# Patient Record
Sex: Female | Born: 1960 | Race: White | Hispanic: No | Marital: Married | Smoking: Current every day smoker
Health system: Southern US, Community
[De-identification: ages and names within clinical notes are randomized; demographics above are authoritative.]

## PROBLEM LIST (undated history)

## (undated) DIAGNOSIS — L930 Discoid lupus erythematosus: Secondary | ICD-10-CM

## (undated) DIAGNOSIS — C4490 Unspecified malignant neoplasm of skin, unspecified: Secondary | ICD-10-CM

## (undated) HISTORY — DX: Discoid lupus erythematosus: L93.0

## (undated) HISTORY — PX: OTHER SURGICAL HISTORY: SHX169

## (undated) HISTORY — DX: Unspecified malignant neoplasm of skin, unspecified: C44.90

---

## 2005-11-23 ENCOUNTER — Ambulatory Visit: Payer: Self-pay | Admitting: Family Medicine

## 2005-11-25 ENCOUNTER — Ambulatory Visit: Payer: Self-pay | Admitting: Family Medicine

## 2015-12-02 ENCOUNTER — Ambulatory Visit (INDEPENDENT_AMBULATORY_CARE_PROVIDER_SITE_OTHER): Payer: Managed Care, Other (non HMO) | Admitting: Family Medicine

## 2015-12-02 ENCOUNTER — Encounter: Payer: Self-pay | Admitting: Family Medicine

## 2015-12-02 VITALS — BP 120/80 | HR 78 | Resp 16 | Ht 66.0 in | Wt 109.6 lb

## 2015-12-02 DIAGNOSIS — Z6379 Other stressful life events affecting family and household: Secondary | ICD-10-CM | POA: Diagnosis not present

## 2015-12-02 DIAGNOSIS — F1010 Alcohol abuse, uncomplicated: Secondary | ICD-10-CM | POA: Diagnosis not present

## 2015-12-02 DIAGNOSIS — Z720 Tobacco use: Secondary | ICD-10-CM | POA: Diagnosis not present

## 2015-12-02 DIAGNOSIS — F17200 Nicotine dependence, unspecified, uncomplicated: Secondary | ICD-10-CM

## 2015-12-02 NOTE — Progress Notes (Signed)
   Subjective:    Patient ID: Jacqueline Lang, female    DOB: 1960-12-16, 55 y.o.   MRN: [id number]  HPI She is here for consult concerning smoking. She has not been here in several years. She now states that she is ready. She has a 30+ pack year history of smoking. She now indicates the need to get in better health. She also has become more involved in her religion and wants a more healthy lifestyle.   Review of Systems     Objective:   Physical Exam Alert and in no distress otherwise not examined       Assessment & Plan:  Current smoker  Stressful life event affecting family  Alcohol abuse The first part of the encounter was spent discussing smoking cessation in regard to habit versus addiction and the use of Chantix which is what she wanted. Activities she needs to call the 800 number for screening smoking. Also recommend she keep track of her smoking habits in regard to when where and why and then make a list of things to do instead of smoking. We will then also start Chantix. At the end of the encounter, he came quite tearful and mentioned the fact that her mother died in 07-07-22 and also a grandchild is some trouble with the law. She states that she then started drinking again and stopped drinking yesterday. I then discussed the fact that we should not go any further with her in cessation. Strongly encouraged her to get involved with hospice bereavement. Explained that under the circumstances it would be inappropriate for me to give her Chantix. She is to return here for further consultation. Also recommended she set up a complete examination in near future as she has not had one in several years. Over 30 minutes, the entire time spent in counseling and coordination of care.

## 2015-12-02 NOTE — Patient Instructions (Addendum)
Call Hospice [phone]  Call 800 quit now Ref the pieces paper around her cigarettes and every time you light up right down when where and why. Do that for about a week then sit down and analyze it. Once you do that ,then you can make a list of things to do instead of smoking.

## 2015-12-15 ENCOUNTER — Ambulatory Visit (INDEPENDENT_AMBULATORY_CARE_PROVIDER_SITE_OTHER): Payer: Managed Care, Other (non HMO) | Admitting: Family Medicine

## 2015-12-15 ENCOUNTER — Encounter: Payer: Self-pay | Admitting: Family Medicine

## 2015-12-15 VITALS — BP 106/60 | HR 67 | Wt 110.0 lb

## 2015-12-15 DIAGNOSIS — Z720 Tobacco use: Secondary | ICD-10-CM | POA: Diagnosis not present

## 2015-12-15 DIAGNOSIS — Z716 Tobacco abuse counseling: Secondary | ICD-10-CM | POA: Diagnosis not present

## 2015-12-15 MED ORDER — VARENICLINE TARTRATE 0.5 MG X 11 & 1 MG X 42 PO MISC: ORAL | 0 refills | Status: DC

## 2015-12-15 NOTE — Progress Notes (Signed)
   Subjective:    Patient ID: Jacqueline Lang, female    DOB: November 06, 1960, 55 y.o.   MRN: [id number]  HPI She is here for recheck. She has remained alcohol free since her last visit. She states that she is ready to quit smoking. I discussed triggers with her however she states that she has not gotten around to making a list of this. She states that she plans to have a quit date in several weeks but is still slightly unsure about this.   Review of Systems     Objective:   Physical Exam Alert and in no distress otherwise not examined       Assessment & Plan:  Encounter for smoking cessation counseling - Plan: varenicline (CHANTIX STARTING MONTH PAK) 0.5 MG X 11 & 1 MG X 42 tablet  I discussed in detail the need for her to look at the triggers on why she smokes and come up with things to do instead of smoking. Encouraged her to set a quit date [place]. Discussed possible side effects of the Chantix. I will have her return here in about 6 weeks.

## 2015-12-15 NOTE — Patient Instructions (Signed)
Make a list of your triggers. Then also make a list of what you will do instead of smoking

## 2016-01-12 ENCOUNTER — Telehealth: Payer: Self-pay | Admitting: Family Medicine

## 2016-01-12 MED ORDER — VARENICLINE TARTRATE 1 MG PO TABS: 1.0000 mg | ORAL_TABLET | Freq: Two times a day (BID) | ORAL | 0 refills | Status: DC

## 2016-01-12 NOTE — Telephone Encounter (Signed)
Rcvd refill request for Chantix continuing pak

## 2016-02-07 ENCOUNTER — Other Ambulatory Visit: Payer: Self-pay | Admitting: Family Medicine

## 2016-02-08 NOTE — Telephone Encounter (Signed)
Is this okay to refill? 

## 2016-02-08 NOTE — Telephone Encounter (Signed)
Needs an appointment but don't let her run out

## 2016-09-02 ENCOUNTER — Telehealth: Payer: Self-pay | Admitting: Family Medicine

## 2016-09-02 NOTE — Telephone Encounter (Signed)
Not sure I fully understand the question Tammy?  Please see me about this.     I would also say if someone is going through depression, that should really be separate visit from physical

## 2016-09-02 NOTE — Telephone Encounter (Addendum)
Pt's daughter, Jacqueline Lang, called wanting to make a CPE appointment for pt with [facility]. She said pt is going through a depression and is overdue for a CPE. Daughter thinks pt will be more comfortable seeing Audelia Acton and will be more open/comfortable discussing her depression and medical concerns with Audelia Acton. Daughter,Crystal, also sees [place]. Is it ok to schedule CPE with [facility] Acton? Call daughter at [phone]

## 2018-04-02 ENCOUNTER — Encounter: Payer: Self-pay | Admitting: Psychiatry

## 2018-04-02 ENCOUNTER — Ambulatory Visit: Payer: BLUE CROSS/BLUE SHIELD | Admitting: Psychiatry

## 2018-04-02 VITALS — BP 136/85 | HR 72

## 2018-04-02 DIAGNOSIS — F1020 Alcohol dependence, uncomplicated: Secondary | ICD-10-CM | POA: Diagnosis not present

## 2018-04-02 DIAGNOSIS — F411 Generalized anxiety disorder: Secondary | ICD-10-CM

## 2018-04-02 DIAGNOSIS — F3176 Bipolar disorder, in full remission, most recent episode depressed: Secondary | ICD-10-CM

## 2018-04-02 MED ORDER — LAMOTRIGINE 150 MG PO TABS: 150.0000 mg | ORAL_TABLET | Freq: Every day | ORAL | 1 refills | Status: DC

## 2018-04-02 MED ORDER — NALTREXONE HCL 50 MG PO TABS: ORAL_TABLET | ORAL | 1 refills | Status: DC

## 2018-04-02 MED ORDER — GABAPENTIN 250 MG/5ML PO SOLN: 500.0000 mg | Freq: Two times a day (BID) | ORAL | 1 refills | Status: DC

## 2018-04-02 MED ORDER — DULOXETINE HCL 60 MG PO CPEP: 60.0000 mg | ORAL_CAPSULE | Freq: Every day | ORAL | 1 refills | Status: DC

## 2018-04-02 MED ORDER — BUSPIRONE HCL 15 MG PO TABS: 15.0000 mg | ORAL_TABLET | Freq: Two times a day (BID) | ORAL | 1 refills | Status: DC

## 2018-04-02 NOTE — Progress Notes (Signed)
Jacqueline Lang [id number] [birth year] 57 y.o.  Subjective:   Patient ID:  Jacqueline Lang is a 57 y.o. (DOB [birth year]) female.  Chief Complaint:  Chief Complaint  Patient presents with  . Alcohol Problem  . Follow-up    h/o Anxiety, Depression    HPI Jacqueline Lang presents to the office today for follow-up of mood and anxiety. She reports that she is now working at [employer] for the past 6 weeks. She reports that she is enjoying her job and was getting depressed with not being able to find a job. She reprts that mood is "much better since I am working. Makes me feel like I am contributing and it gets me out of the house." She reports that her depression has resolved since returning to work. She reports that her anxiety has significantly improved with leaving previous jobs. Denies any acute concerns with anxiety. Reports some family stressors and will worry about her children and grandchildren. She reports that she has been sleeping well. Reports stable appetite and has had 30 lb intentional weight loss. Energy and motivation have been adequate. Denies concentration impairment. She reports brief SI before getting her job after drinking excessive amounts of rum. She reports that she stopped driking liquor. She reports that she has been drinking wine and beer. She reports that she has been trying to manage this and occasionally drinks excessively. "I stopeed for 25 years and now I am back in it and kind of disappointed." She reports "mentally I don't know if I am ready to stop." She reports that she rarely uses Xanax prn. Denies current SI.   Past medication trials: Alprazolam Lamictal Cymbalta Gabapentin BuSpar Trileptal-ineffective Latuda- helped for several days and then signs and symptoms significantly worsened Abilify- concentration difficulties Hydroxyzine Propanolol Lorazepam  Review of Systems:  Review of Systems  Gastrointestinal: Negative.   Musculoskeletal: Negative for  gait problem.  Neurological: Negative for tremors.  Psychiatric/Behavioral:       Please refer to HPI    Medications: I have reviewed the patient's current medications.  Current Outpatient Medications  Medication Sig Dispense Refill  . ALPRAZolam (XANAX) 1 MG tablet Take 1 mg by mouth at bedtime as needed for anxiety.    . busPIRone (BUSPAR) 15 MG tablet Take 1 tablet (15 mg total) by mouth 2 (two) times daily. 180 tablet 1  . DULoxetine (CYMBALTA) 60 MG capsule Take 1 capsule (60 mg total) by mouth daily. 90 capsule 1  . lamoTRIgine (LAMICTAL) 150 MG tablet Take 1 tablet (150 mg total) by mouth daily. 90 tablet 1  . gabapentin (NEURONTIN) 250 MG/5ML solution Take 10 mLs (500 mg total) by mouth 2 (two) times daily. 2025 mL 1  . hydroxychloroquine (PLAQUENIL) 200 MG tablet Take 400 mg by mouth daily.    . Multiple Vitamins-Minerals (STRESS TAB NF PO) Take by mouth.    . naltrexone (DEPADE) 50 MG tablet Take 1 tab po qd x 1 week, and then may increase to 1 tab po BID 90 tablet 1   No current facility-administered medications for this visit.     Medication Side Effects: None  Allergies:  Allergies  Allergen Reactions  . Penicillins     Childhood allergy    Past Medical History:  Diagnosis Date  . Discoid lupus   . Skin cancer     Family History  Problem Relation Age of Onset  . Bipolar disorder Father   . Schizophrenia Sister   . Bipolar disorder Sister   .  Panic disorder Sister   . Bipolar disorder Paternal Grandmother   . Bipolar disorder Sister   . Bipolar disorder Sister   . Anxiety disorder Sister   . Bipolar disorder Sister   . Bipolar disorder Sister   . Anxiety disorder Daughter   . Depression Daughter     Social History   Socioeconomic History  . Marital status: Married    Spouse name: Not on file  . Number of children: Not on file  . Years of education: Not on file  . Highest education level: Not on file  Occupational History  . Not on file   Social Needs  . Financial resource strain: Not on file  . Food insecurity:    Worry: Not on file    Inability: Not on file  . Transportation needs:    Medical: Not on file    Non-medical: Not on file  Tobacco Use  . Smoking status: Former Smoker    Types: Cigarettes    Start date: 12/02/1995  . Smokeless tobacco: Never Used  Substance and Sexual Activity  . Alcohol use: Yes  . Drug use: Not on file  . Sexual activity: Not on file  Lifestyle  . Physical activity:    Days per week: Not on file    Minutes per session: Not on file  . Stress: Not on file  Relationships  . Social connections:    Talks on phone: Not on file    Gets together: Not on file    Attends religious service: Not on file    Active member of club or organization: Not on file    Attends meetings of clubs or organizations: Not on file    Relationship status: Not on file  . Intimate partner violence:    Fear of current or ex partner: Not on file    Emotionally abused: Not on file    Physically abused: Not on file    Forced sexual activity: Not on file  Other Topics Concern  . Not on file  Social History Narrative  . Not on file    Past Medical History, Surgical history, Social history, and Family history were reviewed and updated as appropriate.   Please see review of systems for further details on the patient's review from today.   Objective:   Physical Exam:  BP 136/85   Pulse 72   Physical Exam  Constitutional: She is oriented to person, place, and time. She appears well-developed. No distress.  Musculoskeletal: She exhibits no deformity.  Neurological: She is alert and oriented to person, place, and time. Coordination normal.  Psychiatric: She has a normal mood and affect. Her speech is normal and behavior is normal. Judgment and thought content normal. Her mood appears not anxious. Her affect is not angry, not blunt, not labile and not inappropriate. Cognition and memory are normal. She does  not exhibit a depressed mood. She expresses no homicidal and no suicidal ideation. She expresses no suicidal plans and no homicidal plans.  Insight intact. No auditory or visual hallucinations. No delusions.     Lab Review:  No results found for: NA, K, CL, CO2, GLUCOSE, BUN, CREATININE, CALCIUM, PROT, ALBUMIN, AST, ALT, ALKPHOS, BILITOT, GFRNONAA, GFRAA  No results found for: WBC, RBC, HGB, HCT, PLT, MCV, MCH, MCHC, RDW, LYMPHSABS, MONOABS, EOSABS, BASOSABS  No results found for: POCLITH, LITHIUM   No results found for: PHENYTOIN, PHENOBARB, VALPROATE, CBMZ   .res Assessment: Plan:   Patient seen for 30 minutes greater  than 50% of visit spent counseling patient regarding treatment options for alcohol dependence.  Patient reports some reluctance and ambivalence in regards to stopping alcohol use.  Discussed possible treatment options and patient reports that she would be interested in a medication that would help with alcohol cravings.  Discussed potential benefits, risks, and side effects of naltrexone.  Discussed that naltrexone blocks opiate receptors and that she should inform any medical providers that she is taking naltrexone in the event of an acute medical issue.  Patient verbalizes understanding.  Discussed starting naltrexone 50 mg daily for 1 week and that she could increase to 50 mg twice daily if needed and tolerated. 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  Future Appointments  Date Time Provider [place]  10/02/2018 10:00 AM Thayer Headings, PMHNP CP-CP None    No orders of the defined types were placed in this encounter.     -------------------------------

## 2018-04-17 ENCOUNTER — Other Ambulatory Visit: Payer: Self-pay

## 2018-04-17 MED ORDER — ALPRAZOLAM 1 MG PO TABS: 1.0000 mg | ORAL_TABLET | Freq: Every evening | ORAL | 5 refills | Status: DC | PRN

## 2018-04-30 ENCOUNTER — Other Ambulatory Visit: Payer: Self-pay | Admitting: Psychiatry

## 2018-04-30 DIAGNOSIS — F3176 Bipolar disorder, in full remission, most recent episode depressed: Secondary | ICD-10-CM

## 2018-04-30 DIAGNOSIS — F411 Generalized anxiety disorder: Secondary | ICD-10-CM

## 2018-06-14 ENCOUNTER — Telehealth: Payer: Self-pay | Admitting: Psychiatry

## 2018-06-14 NOTE — Telephone Encounter (Signed)
Pt left v-mail request refill for Xanax.   Pharm not given

## 2018-06-14 NOTE — Telephone Encounter (Signed)
Called [facility] to confirm xanax rx received from 04/2018 with 5 refills

## 2018-08-24 ENCOUNTER — Other Ambulatory Visit: Payer: Self-pay | Admitting: Psychiatry

## 2018-08-24 DIAGNOSIS — F411 Generalized anxiety disorder: Secondary | ICD-10-CM

## 2018-08-24 NOTE — Telephone Encounter (Signed)
Left pt. A VM. Will try to call her again.

## 2018-08-24 NOTE — Telephone Encounter (Signed)
Patient left vm @12 :11 today stated she would like her Gabepentin changed from liquid to a pill to be sent to [facility] on [address]

## 2018-08-27 MED ORDER — GABAPENTIN 300 MG PO CAPS: 300.0000 mg | ORAL_CAPSULE | Freq: Three times a day (TID) | ORAL | 1 refills | Status: DC

## 2018-08-27 NOTE — Addendum Note (Signed)
Addended by: Sharyl Nimrod on: 08/27/2018 03:38 PM   Modules accepted: Orders

## 2018-08-27 NOTE — Telephone Encounter (Signed)
I left her another VM today to return call.

## 2018-08-27 NOTE — Telephone Encounter (Signed)
Left pt. A VM of changes and informed to call back if dose not effective.

## 2018-09-21 ENCOUNTER — Other Ambulatory Visit: Payer: Self-pay | Admitting: Psychiatry

## 2018-09-21 DIAGNOSIS — F411 Generalized anxiety disorder: Secondary | ICD-10-CM

## 2018-10-02 ENCOUNTER — Encounter: Payer: Self-pay | Admitting: Psychiatry

## 2018-10-02 ENCOUNTER — Ambulatory Visit (INDEPENDENT_AMBULATORY_CARE_PROVIDER_SITE_OTHER): Payer: BLUE CROSS/BLUE SHIELD | Admitting: Psychiatry

## 2018-10-02 ENCOUNTER — Other Ambulatory Visit: Payer: Self-pay

## 2018-10-02 DIAGNOSIS — F411 Generalized anxiety disorder: Secondary | ICD-10-CM

## 2018-10-02 DIAGNOSIS — F3176 Bipolar disorder, in full remission, most recent episode depressed: Secondary | ICD-10-CM | POA: Diagnosis not present

## 2018-10-02 DIAGNOSIS — G4700 Insomnia, unspecified: Secondary | ICD-10-CM

## 2018-10-02 MED ORDER — LAMOTRIGINE 150 MG PO TABS: 150.0000 mg | ORAL_TABLET | Freq: Every day | ORAL | 1 refills | Status: DC

## 2018-10-02 MED ORDER — BUSPIRONE HCL 15 MG PO TABS: 15.0000 mg | ORAL_TABLET | Freq: Two times a day (BID) | ORAL | 1 refills | Status: DC

## 2018-10-02 MED ORDER — DULOXETINE HCL 60 MG PO CPEP: 60.0000 mg | ORAL_CAPSULE | Freq: Every day | ORAL | 1 refills | Status: DC

## 2018-10-02 NOTE — Progress Notes (Signed)
Jacqueline Lang [id number] [birth year] 59 y.o.  Virtual Visit via Telephone Note  I connected with@ on 10/02/18 at 10:00 AM EDT by telephone and verified that I am speaking with the correct person using two identifiers.   I discussed the limitations, risks, security and privacy concerns of performing an evaluation and management service by telephone and the availability of in person appointments. I also discussed with the patient that there may be a patient responsible charge related to this service. The patient expressed understanding and agreed to proceed.   I discussed the assessment and treatment plan with the patient. The patient was provided an opportunity to ask questions and all were answered. The patient agreed with the plan and demonstrated an understanding of the instructions.   The patient was advised to call back or seek an in-person evaluation if the symptoms worsen or if the condition fails to improve as anticipated.  I provided 30 minutes of non-face-to-face time during this encounter.  The patient was located at home.  The provider was located at home.   Thayer Headings, PMHNP   Subjective:   Patient ID:  Jacqueline Lang is a 58 y.o. (DOB Jan 28, 1961) female.  Chief Complaint:  Chief Complaint  Patient presents with  . Follow-up    h/o Depression, anxiety, and ETOH misuse    HPI NICHOLE NEYER presents for follow-up of anxiety, mood, and ETOH dependence. She reports that she had n/v with Naltrexone and that she did not take it again. She reports that she has been drinking more with being furloughed. Reports drinking a large bottle of wine a night. Reports that she has been debating about whether she wants to quit drinking. Denies any recent panic attacks- "just don't feel relaxed." Reports some worry r/t pandemic but does not consider worry to be excessive. She reports that her mood has been "ok." Denies irritability or depressed mood. She reports some difficulty falling  asleep and awakens several times during the night. Reports that she goes to bed between 1-2 am and sleeps until 9-10 am. Appetite has been good. She reports that her motivation is lower than she would like and is not cleaning her house like she normally would. Has not been walking outside like she normally would. Reports energy is ok. She reports adequate concentration. Denies SI.  Denies impulsive or risky behaviors. Denies any recent excessive spending or elevated mood.  She reports that she has been furloughed and is receiving unemployment. She reports that one of her sisters is living with her right now until she can be eligible for LTC facility. Reports sister has bipolar d/o.   Reports that cost of oral solution of gabapentin was more expensive and will switch to capsule. Reports that she often forgets to take second dose of Buspar.  Reports taking 1/2 tab of Xanax at noon and at HS.   Past medication trials: Alprazolam Lamictal Cymbalta Gabapentin BuSpar Trileptal-ineffective Latuda- helped for several days and then signs and symptoms significantly worsened Abilify- concentration difficulties Hydroxyzine Propanolol Lorazepam   Review of Systems:  Review of Systems  Musculoskeletal: Negative for gait problem.  Neurological: Negative for tremors and headaches.  Psychiatric/Behavioral:       Please refer to HPI    Medications: I have reviewed the patient's current medications.  Current Outpatient Medications  Medication Sig Dispense Refill  . busPIRone (BUSPAR) 15 MG tablet Take 1 tablet (15 mg total) by mouth 2 (two) times daily. 180 tablet 1  . gabapentin (NEURONTIN)  300 MG capsule TAKE 1 CAPSULE (300 MG TOTAL) BY MOUTH 3 (THREE) TIMES DAILY 270 capsule 0  . ALPRAZolam (XANAX) 1 MG tablet Take 1 tablet (1 mg total) by mouth at bedtime as needed for anxiety. 30 tablet 5  . DULoxetine (CYMBALTA) 60 MG capsule Take 1 capsule (60 mg total) by mouth daily. 90 capsule 1  .  hydroxychloroquine (PLAQUENIL) 200 MG tablet Take 400 mg by mouth daily.    Marland Kitchen lamoTRIgine (LAMICTAL) 150 MG tablet Take 1 tablet (150 mg total) by mouth daily. 90 tablet 1  . Multiple Vitamins-Minerals (STRESS TAB NF PO) Take by mouth.     No current facility-administered medications for this visit.     Medication Side Effects: None  Allergies:  Allergies  Allergen Reactions  . Penicillins     Childhood allergy    Past Medical History:  Diagnosis Date  . Discoid lupus   . Skin cancer     Family History  Problem Relation Age of Onset  . Bipolar disorder Father   . Schizophrenia Sister   . Bipolar disorder Sister   . Panic disorder Sister   . Bipolar disorder Paternal Grandmother   . Bipolar disorder Sister   . Bipolar disorder Sister   . Anxiety disorder Sister   . Bipolar disorder Sister   . Bipolar disorder Sister   . Anxiety disorder Daughter   . Depression Daughter     Social History   Socioeconomic History  . Marital status: Married    Spouse name: Not on file  . Number of children: Not on file  . Years of education: Not on file  . Highest education level: Not on file  Occupational History  . Not on file  Social Needs  . Financial resource strain: Not on file  . Food insecurity:    Worry: Not on file    Inability: Not on file  . Transportation needs:    Medical: Not on file    Non-medical: Not on file  Tobacco Use  . Smoking status: Former Smoker    Types: Cigarettes    Start date: 12/02/1995  . Smokeless tobacco: Never Used  Substance and Sexual Activity  . Alcohol use: Yes  . Drug use: Not on file  . Sexual activity: Not on file  Lifestyle  . Physical activity:    Days per week: Not on file    Minutes per session: Not on file  . Stress: Not on file  Relationships  . Social connections:    Talks on phone: Not on file    Gets together: Not on file    Attends religious service: Not on file    Active member of club or organization: Not on  file    Attends meetings of clubs or organizations: Not on file    Relationship status: Not on file  . Intimate partner violence:    Fear of current or ex partner: Not on file    Emotionally abused: Not on file    Physically abused: Not on file    Forced sexual activity: Not on file  Other Topics Concern  . Not on file  Social History Narrative  . Not on file    Past Medical History, Surgical history, Social history, and Family history were reviewed and updated as appropriate.   Please see review of systems for further details on the patient's review from today.   Objective:   Physical Exam:  There were no vitals taken for this visit.  Physical Exam Neurological:     Mental Status: She is alert and oriented to person, place, and time.     Cranial Nerves: No dysarthria.  Psychiatric:        Attention and Perception: Attention normal.        Mood and Affect: Mood normal.        Speech: Speech normal.        Behavior: Behavior is cooperative.        Thought Content: Thought content normal. Thought content is not paranoid or delusional. Thought content does not include homicidal or suicidal ideation. Thought content does not include homicidal or suicidal plan.        Cognition and Memory: Cognition and memory normal.        Judgment: Judgment normal.     Comments: Pt laughing frequently throughout exam which is inconsistent with baseline.     Lab Review:  No results found for: NA, K, CL, CO2, GLUCOSE, BUN, CREATININE, CALCIUM, PROT, ALBUMIN, AST, ALT, ALKPHOS, BILITOT, GFRNONAA, GFRAA  No results found for: WBC, RBC, HGB, HCT, PLT, MCV, MCH, MCHC, RDW, LYMPHSABS, MONOABS, EOSABS, BASOSABS  No results found for: POCLITH, LITHIUM   No results found for: PHENYTOIN, PHENOBARB, VALPROATE, CBMZ   .res Assessment: Plan:   Encourage patient to contact office if/when she would like assistance with stopping EtOH use.  Patient verbalizes understanding. Continue Cymbalta 60 mg  daily for anxiety and depression. Continue lamotrigine 150 mg daily for mood. Continue BuSpar 15 mg once to twice daily for anxiety. Continue gabapentin for anxiety.  Encourage patient to take gabapentin closer to bedtime to possibly improve insomnia.  Also advised patient to contact office if she has any issues of switching from oral solution to capsule since she reports that she is continuing to use remaining gabapentin oral solution. Will continue Xanax 1 mg daily as needed.  Patient advised not to take in combination with alcohol due to risks of CNS depression.  Patient verbalized understanding. Patient to follow-up in 6 months or sooner if clinically indicated.  Generalized anxiety disorder - Plan: busPIRone (BUSPAR) 15 MG tablet, DULoxetine (CYMBALTA) 60 MG capsule  Depressed bipolar I disorder in remission ([place]) - Plan: DULoxetine (CYMBALTA) 60 MG capsule, lamoTRIgine (LAMICTAL) 150 MG tablet  Insomnia, unspecified type  Please see After Visit Summary for patient specific instructions.  Future Appointments  Date Time Provider [place]  03/26/2019 10:00 AM Thayer Headings, PMHNP CP-CP None    No orders of the defined types were placed in this encounter.     -------------------------------

## 2018-12-17 ENCOUNTER — Other Ambulatory Visit: Payer: Self-pay | Admitting: Psychiatry

## 2018-12-17 DIAGNOSIS — F411 Generalized anxiety disorder: Secondary | ICD-10-CM

## 2018-12-20 ENCOUNTER — Other Ambulatory Visit: Payer: Self-pay | Admitting: Psychiatry

## 2018-12-20 NOTE — Telephone Encounter (Signed)
Okay to add refills next appt is 03/2019

## 2019-01-21 ENCOUNTER — Other Ambulatory Visit: Payer: Self-pay | Admitting: Psychiatry

## 2019-01-21 DIAGNOSIS — F3176 Bipolar disorder, in full remission, most recent episode depressed: Secondary | ICD-10-CM

## 2019-03-20 ENCOUNTER — Other Ambulatory Visit: Payer: Self-pay | Admitting: Psychiatry

## 2019-03-20 DIAGNOSIS — F411 Generalized anxiety disorder: Secondary | ICD-10-CM

## 2019-03-20 NOTE — Telephone Encounter (Signed)
Apt 11/17

## 2019-03-23 ENCOUNTER — Other Ambulatory Visit: Payer: Self-pay | Admitting: Psychiatry

## 2019-03-23 DIAGNOSIS — F411 Generalized anxiety disorder: Secondary | ICD-10-CM

## 2019-03-23 DIAGNOSIS — F3176 Bipolar disorder, in full remission, most recent episode depressed: Secondary | ICD-10-CM

## 2019-03-26 ENCOUNTER — Encounter: Payer: Self-pay | Admitting: Psychiatry

## 2019-03-26 ENCOUNTER — Ambulatory Visit (INDEPENDENT_AMBULATORY_CARE_PROVIDER_SITE_OTHER): Payer: BLUE CROSS/BLUE SHIELD | Admitting: Psychiatry

## 2019-03-26 ENCOUNTER — Other Ambulatory Visit: Payer: Self-pay

## 2019-03-26 DIAGNOSIS — F411 Generalized anxiety disorder: Secondary | ICD-10-CM

## 2019-03-26 DIAGNOSIS — F3176 Bipolar disorder, in full remission, most recent episode depressed: Secondary | ICD-10-CM

## 2019-03-26 MED ORDER — DULOXETINE HCL 60 MG PO CPEP: ORAL_CAPSULE | ORAL | 1 refills | Status: DC

## 2019-03-26 MED ORDER — LAMOTRIGINE 150 MG PO TABS: 150.0000 mg | ORAL_TABLET | Freq: Every day | ORAL | 1 refills | Status: DC

## 2019-03-26 MED ORDER — GABAPENTIN 300 MG PO CAPS: ORAL_CAPSULE | ORAL | 1 refills | Status: DC

## 2019-03-26 MED ORDER — ALPRAZOLAM 1 MG PO TABS: 1.0000 mg | ORAL_TABLET | Freq: Every evening | ORAL | 5 refills | Status: DC | PRN

## 2019-03-26 MED ORDER — BUSPIRONE HCL 15 MG PO TABS: 15.0000 mg | ORAL_TABLET | Freq: Two times a day (BID) | ORAL | 1 refills | Status: DC

## 2019-03-26 NOTE — Progress Notes (Signed)
Jacqueline Lang [id number] 1960-10-19 58 y.o.  Subjective:   Patient ID:  Jacqueline Lang is a 58 y.o. (DOB 13-May-1960) female.  Chief Complaint:  Chief Complaint  Patient presents with  . Follow-up    History of depression, anxiety, insomnia    HPI Jacqueline Lang presents to the office today for follow-up of depression and anxiety. Father died in 01/01/23 in her home with hospice care.  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 Plans to take some time off and search for work in the spring. She has been trying to help other family members.   Has been trying to go to bed earlier and wake up earlier, as if she was going to work. Has had moments of increased anxiety and denies panic attack. Some worry about family members.   Reports ETOH has been about the same or less. She reports drinking 3/4 bottle of wine a night. Started smoking again around the death of her father. She reports that her twin sister and other family members had significant difficulty with father's death and pt has been trying to focus on helping them. Denies persistent sad mood. Denies significant irritability. She reports adequate sleep with gabapentin or xanax. She reports that her appetite is ok and consistent with baseline. Reports that she intentionally lost weight. Energy and motivation have been good. Concentration has been adequate. Denies SI.   She reports that husband has been supportive.  Review of Systems:  Review of Systems  Musculoskeletal: Negative for gait problem.  Neurological: Negative for tremors.  Psychiatric/Behavioral:       Please refer to HPI    Medications: I have reviewed the patient's current medications.  Current Outpatient Medications  Medication Sig Dispense Refill  . [START ON 04/18/2019] ALPRAZolam (XANAX) 1 MG tablet Take 1 tablet (1 mg total) by mouth at bedtime as needed for anxiety. 30 tablet 5  . busPIRone (BUSPAR) 15 MG tablet  Take 1 tablet (15 mg total) by mouth 2 (two) times daily. 180 tablet 1  . DULoxetine (CYMBALTA) 60 MG capsule TAKE 1 CAPSULE BY MOUTH EVERY DAY 90 capsule 1  . gabapentin (NEURONTIN) 300 MG capsule TAKE 1 CAPSULE BY MOUTH THREE TIMES A DAY 270 capsule 1  . hydroxychloroquine (PLAQUENIL) 200 MG tablet Take 400 mg by mouth daily.    Marland Kitchen lamoTRIgine (LAMICTAL) 150 MG tablet Take 1 tablet (150 mg total) by mouth daily. 90 tablet 1  . Multiple Vitamins-Minerals (STRESS TAB NF PO) Take by mouth.     No current facility-administered medications for this visit.     Medication Side Effects: None  Allergies:  Allergies  Allergen Reactions  . Penicillins     Childhood allergy    Past Medical History:  Diagnosis Date  . Discoid lupus   . Skin cancer     Family History  Problem Relation Age of Onset  . Bipolar disorder Father   . Schizophrenia Sister   . Bipolar disorder Sister   . Panic disorder Sister   . Bipolar disorder Paternal Grandmother   . Bipolar disorder Sister   . Bipolar disorder Sister   . Anxiety disorder Sister   . Bipolar disorder Sister   . Bipolar disorder Sister   . Anxiety disorder Daughter   . Depression Daughter     Social History   Socioeconomic History  . Marital status: Married    Spouse name: Not on file  .  Number of children: Not on file  . Years of education: Not on file  . Highest education level: Not on file  Occupational History  . Not on file  Social Needs  . Financial resource strain: Not on file  . Food insecurity    Worry: Not on file    Inability: Not on file  . Transportation needs    Medical: Not on file    Non-medical: Not on file  Tobacco Use  . Smoking status: Former Smoker    Types: Cigarettes    Start date: 12/02/1995  . Smokeless tobacco: Never Used  Substance and Sexual Activity  . Alcohol use: Yes  . Drug use: Not on file  . Sexual activity: Not on file  Lifestyle  . Physical activity    Days per week: Not on file     Minutes per session: Not on file  . Stress: Not on file  Relationships  . Social Herbalist on phone: Not on file    Gets together: Not on file    Attends religious service: Not on file    Active member of club or organization: Not on file    Attends meetings of clubs or organizations: Not on file    Relationship status: Not on file  . Intimate partner violence    Fear of current or ex partner: Not on file    Emotionally abused: Not on file    Physically abused: Not on file    Forced sexual activity: Not on file  Other Topics Concern  . Not on file  Social History Narrative  . Not on file    Past Medical History, Surgical history, Social history, and Family history were reviewed and updated as appropriate.   Please see review of systems for further details on the patient's review from today.   Objective:   Physical Exam:  Wt 120 lb (54.4 kg)   BMI 19.37 kg/m   Physical Exam Constitutional:      General: She is not in acute distress.    Appearance: She is well-developed.  Musculoskeletal:        General: No deformity.  Neurological:     Mental Status: She is alert and oriented to person, place, and time.     Coordination: Coordination normal.  Psychiatric:        Attention and Perception: Attention and perception normal. She does not perceive auditory or visual hallucinations.        Mood and Affect: Mood normal. Mood is not anxious or depressed. Affect is not labile, blunt, angry or inappropriate.        Speech: Speech normal.        Behavior: Behavior normal.        Thought Content: Thought content normal. Thought content does not include homicidal or suicidal ideation. Thought content does not include homicidal or suicidal plan.        Cognition and Memory: Cognition and memory normal.        Judgment: Judgment normal.     Comments: Insight intact. No delusions.      Lab Review:  No results found for: NA, K, CL, CO2, GLUCOSE, BUN, CREATININE,  CALCIUM, PROT, ALBUMIN, AST, ALT, ALKPHOS, BILITOT, GFRNONAA, GFRAA  No results found for: WBC, RBC, HGB, HCT, PLT, MCV, MCH, MCHC, RDW, LYMPHSABS, MONOABS, EOSABS, BASOSABS  No results found for: POCLITH, LITHIUM   No results found for: PHENYTOIN, PHENOBARB, VALPROATE, CBMZ   .res Assessment: Plan:  Will continue current plan of care since target signs and symptoms are well controlled without any tolerability issues. Patient reports that she has been taking BuSpar typically once daily.  Recommended increasing to twice daily to improve response and that she could take second dose earlier or take a lower dose if she experiences any tolerability issues. Patient to follow-up in 6 months or sooner if clinically indicated. Patient advised to contact office with any questions, adverse effects, or acute worsening in signs and symptoms.  Jacqueline Lang was seen today for follow-up.  Diagnoses and all orders for this visit:  Generalized anxiety disorder -     ALPRAZolam (XANAX) 1 MG tablet; Take 1 tablet (1 mg total) by mouth at bedtime as needed for anxiety. -     busPIRone (BUSPAR) 15 MG tablet; Take 1 tablet (15 mg total) by mouth 2 (two) times daily. 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     Please see After Visit Summary for patient specific instructions.  Future Appointments  Date Time Provider [place]  09/23/2019  9:30 AM Thayer Headings, PMHNP CP-CP None    No orders of the defined types were placed in this encounter.   -------------------------------

## 2019-04-16 ENCOUNTER — Other Ambulatory Visit: Payer: Self-pay | Admitting: Psychiatry

## 2019-04-16 DIAGNOSIS — F411 Generalized anxiety disorder: Secondary | ICD-10-CM

## 2019-04-18 ENCOUNTER — Other Ambulatory Visit: Payer: Self-pay | Admitting: Psychiatry

## 2019-04-18 DIAGNOSIS — F411 Generalized anxiety disorder: Secondary | ICD-10-CM

## 2019-08-21 ENCOUNTER — Other Ambulatory Visit: Payer: Self-pay | Admitting: Psychiatry

## 2019-08-21 DIAGNOSIS — F3176 Bipolar disorder, in full remission, most recent episode depressed: Secondary | ICD-10-CM

## 2019-09-16 ENCOUNTER — Encounter: Payer: Self-pay | Admitting: Psychiatry

## 2019-09-16 ENCOUNTER — Other Ambulatory Visit: Payer: Self-pay

## 2019-09-16 ENCOUNTER — Ambulatory Visit (INDEPENDENT_AMBULATORY_CARE_PROVIDER_SITE_OTHER): Payer: 59 | Admitting: Psychiatry

## 2019-09-16 DIAGNOSIS — F411 Generalized anxiety disorder: Secondary | ICD-10-CM | POA: Diagnosis not present

## 2019-09-16 DIAGNOSIS — F3176 Bipolar disorder, in full remission, most recent episode depressed: Secondary | ICD-10-CM

## 2019-09-16 MED ORDER — LAMOTRIGINE 150 MG PO TABS: 150.0000 mg | ORAL_TABLET | Freq: Every day | ORAL | 1 refills | Status: DC

## 2019-09-16 MED ORDER — DULOXETINE HCL 60 MG PO CPEP: ORAL_CAPSULE | ORAL | 1 refills | Status: DC

## 2019-09-16 MED ORDER — ALPRAZOLAM 1 MG PO TABS: 1.0000 mg | ORAL_TABLET | Freq: Every evening | ORAL | 5 refills | Status: DC | PRN

## 2019-09-16 NOTE — Progress Notes (Signed)
Jacqueline Lang [id number] 10-30-1960 59 y.o.  Subjective:   Patient ID:  Jacqueline Lang is a 59 y.o. (DOB 10/07/1960) female.  Chief Complaint:  Chief Complaint  Patient presents with  . Follow-up    h/o Anxiety and depression    HPI Jacqueline Lang presents to the office today for follow-up of mood and anxiety. She reports that she is still on unemployment. Has been working some on yard again and has not had the desire to do this in years. She reports that she has been working on it extensively. She reports that she has lost some weight with doing yard work. Denies any impulsive or risky behavior. Spent some money to help daughter organize her house and improve her home. Denies any other manic s/s.   She reports that she has "moments" of anxiety. 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 She denies any persistent depression. She reports that she will periodically grieve the loss of her father especially with certain triggers. Energy and motivation have been good. Has been sleeping ok. Averaging about 6 hours a night. Appetite has been good. Does not eat breakfast. Eats lunch and dinner. Concentration has been good. Denies SI.   She was taking Gabapentin only once a day and then increased to TID. Was taking Buspar once daily and increased this to BID a month ago. She reports that her energy has improved.   She reports that her ETOH use has been the "same. I'm not at that place yet."   Past medication trials: Alprazolam Lamictal Cymbalta Gabapentin BuSpar Trileptal-ineffective Latuda- helped for several days and then signs and symptoms significantly worsened Abilify- concentration difficulties Hydroxyzine Propanolol Lorazepam  Review of Systems:  Review of Systems  Musculoskeletal: Positive for back pain. Negative for gait problem.  Skin: Negative for rash.  Neurological: Negative for tremors.  Psychiatric/Behavioral:       Please  refer to HPI    Medications: I have reviewed the patient's current medications.  Current Outpatient Medications  Medication Sig Dispense Refill  . [START ON 09/18/2019] ALPRAZolam (XANAX) 1 MG tablet Take 1 tablet (1 mg total) by mouth at bedtime as needed for anxiety. 30 tablet 5  . busPIRone (BUSPAR) 15 MG tablet Take 15 mg by mouth 2 (two) times daily.    . DULoxetine (CYMBALTA) 60 MG capsule TAKE 1 CAPSULE BY MOUTH EVERY DAY 90 capsule 1  . gabapentin (NEURONTIN) 300 MG capsule TAKE 1 CAPSULE BY MOUTH THREE TIMES A DAY 270 capsule 1  . lamoTRIgine (LAMICTAL) 150 MG tablet Take 1 tablet (150 mg total) by mouth daily. 90 tablet 1  . hydroxychloroquine (PLAQUENIL) 200 MG tablet Take 400 mg by mouth daily.     No current facility-administered medications for this visit.    Medication Side Effects: None  Allergies:  Allergies  Allergen Reactions  . Penicillins     Childhood allergy    Past Medical History:  Diagnosis Date  . Discoid lupus   . Skin cancer     Family History  Problem Relation Age of Onset  . Bipolar disorder Father   . Schizophrenia Sister   . Bipolar disorder Sister   . Panic disorder Sister   . Bipolar disorder Paternal Grandmother   . Bipolar disorder Sister   . Bipolar disorder Sister   . Anxiety disorder Sister   . Bipolar disorder Sister   . Bipolar disorder Sister   . Anxiety disorder Daughter   .  Depression Daughter     Social History   Socioeconomic History  . Marital status: Married    Spouse name: Not on file  . Number of children: Not on file  . Years of education: Not on file  . Highest education level: Not on file  Occupational History  . Not on file  Tobacco Use  . Smoking status: Current Every Day Smoker    Types: Cigarettes    Start date: 12/02/1995  . Smokeless tobacco: Never Used  Substance and Sexual Activity  . Alcohol use: Yes  . Drug use: Not on file  . Sexual activity: Not on file  Other Topics Concern  . Not on  file  Social History Narrative  . Not on file   Social Determinants of Health   Financial Resource Strain:   . Difficulty of Paying Living Expenses:   Food Insecurity:   . Worried About Charity fundraiser in the Last Year:   . Arboriculturist in the Last Year:   Transportation Needs:   . Film/video editor (Medical):   Marland Kitchen Lack of Transportation (Non-Medical):   Physical Activity:   . Days of Exercise per Week:   . Minutes of Exercise per Session:   Stress:   . Feeling of Stress :   Social Connections:   . Frequency of Communication with Friends and Family:   . Frequency of Social Gatherings with Friends and Family:   . Attends Religious Services:   . Active Member of Clubs or Organizations:   . Attends Archivist Meetings:   Marland Kitchen Marital Status:   Intimate Partner Violence:   . Fear of Current or Ex-Partner:   . Emotionally Abused:   Marland Kitchen Physically Abused:   . Sexually Abused:     Past Medical History, Surgical history, Social history, and Family history were reviewed and updated as appropriate.   Please see review of systems for further details on the patient's review from today.   Objective:   Physical Exam:  There were no vitals taken for this visit.  Physical Exam Constitutional:      General: She is not in acute distress. Musculoskeletal:        General: No deformity.  Neurological:     Mental Status: She is alert and oriented to person, place, and time.     Coordination: Coordination normal.  Psychiatric:        Attention and Perception: Attention and perception normal. She does not perceive auditory or visual hallucinations.        Mood and Affect: Mood normal. Mood is not anxious or depressed. Affect is not labile, blunt, angry or inappropriate.        Speech: Speech normal.        Behavior: Behavior normal.        Thought Content: Thought content normal. Thought content is not paranoid or delusional. Thought content does not include homicidal  or suicidal ideation. Thought content does not include homicidal or suicidal plan.        Cognition and Memory: Cognition and memory normal.        Judgment: Judgment normal.     Comments: Insight intact     Lab Review:  No results found for: NA, K, CL, CO2, GLUCOSE, BUN, CREATININE, CALCIUM, PROT, ALBUMIN, AST, ALT, ALKPHOS, BILITOT, GFRNONAA, GFRAA  No results found for: WBC, RBC, HGB, HCT, PLT, MCV, MCH, MCHC, RDW, LYMPHSABS, MONOABS, EOSABS, BASOSABS  No results found for: POCLITH, LITHIUM  No results found for: PHENYTOIN, PHENOBARB, VALPROATE, CBMZ   .res Assessment: Plan:   Will continue current plan of care since target signs and symptoms are well controlled without any tolerability issues. Pt reports that she is not ready to decrease ETOH use at this time. Pt advised to notify provider if she would like any treatment or referrals to help with stopping ETOH in the future and pt verbalized understanding.  Continue Cymbalta 60 mg po qd for depression and anxiety.  Continue Buspar 15 mg po BID for anxiety.  Continue Gabapentin 300 mg po TID for anxiety.  Continue Lamictal 150 mg po qd for mood s/s. Continue Xanax 1 mg po QHS prn insomnia. Pt to f/u in 6 months or sooner if clinically indicated.  Patient advised to contact office with any questions, adverse effects, or acute worsening in signs and symptoms.  Lakaila was seen today for follow-up.  Diagnoses and all orders for this visit:  Generalized anxiety disorder -     ALPRAZolam (XANAX) 1 MG tablet; Take 1 tablet (1 mg total) by mouth at bedtime as needed for anxiety. 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     Please see After Visit Summary for patient specific  instructions.  Future Appointments  Date Time Provider [place]  03/18/2020 10:00 AM Thayer Headings, PMHNP CP-CP None    No orders of the defined types were placed in this encounter.   -------------------------------

## 2019-09-23 ENCOUNTER — Ambulatory Visit: Payer: Self-pay | Admitting: Psychiatry

## 2019-10-21 ENCOUNTER — Other Ambulatory Visit: Payer: Self-pay | Admitting: Psychiatry

## 2019-10-21 DIAGNOSIS — F411 Generalized anxiety disorder: Secondary | ICD-10-CM

## 2020-02-18 ENCOUNTER — Other Ambulatory Visit: Payer: Self-pay | Admitting: Psychiatry

## 2020-02-18 DIAGNOSIS — F411 Generalized anxiety disorder: Secondary | ICD-10-CM

## 2020-02-18 NOTE — Telephone Encounter (Signed)
review 

## 2020-03-16 ENCOUNTER — Encounter: Payer: Self-pay | Admitting: Psychiatry

## 2020-03-16 ENCOUNTER — Other Ambulatory Visit: Payer: Self-pay

## 2020-03-16 ENCOUNTER — Ambulatory Visit (INDEPENDENT_AMBULATORY_CARE_PROVIDER_SITE_OTHER): Payer: 59 | Admitting: Psychiatry

## 2020-03-16 DIAGNOSIS — F411 Generalized anxiety disorder: Secondary | ICD-10-CM

## 2020-03-16 DIAGNOSIS — F3176 Bipolar disorder, in full remission, most recent episode depressed: Secondary | ICD-10-CM

## 2020-03-16 DIAGNOSIS — G4700 Insomnia, unspecified: Secondary | ICD-10-CM

## 2020-03-16 MED ORDER — ALPRAZOLAM 1 MG PO TABS: 1.0000 mg | ORAL_TABLET | Freq: Every evening | ORAL | 5 refills | Status: DC | PRN

## 2020-03-16 MED ORDER — DULOXETINE HCL 60 MG PO CPEP: ORAL_CAPSULE | ORAL | 1 refills | Status: DC

## 2020-03-16 MED ORDER — LAMOTRIGINE 150 MG PO TABS: 150.0000 mg | ORAL_TABLET | Freq: Every day | ORAL | 1 refills | Status: DC

## 2020-03-16 NOTE — Progress Notes (Signed)
Jacqueline Lang [id number] 10-15-1960 59 y.o.  Subjective:   Patient ID:  Jacqueline Lang is a 59 y.o. (DOB 1960-06-30) female.  Chief Complaint:  Chief Complaint  Patient presents with  . Follow-up    h/o anxiety, depression, and sleep disturbance    HPI Jacqueline Lang presents to the office today for follow-up of mood disturbance and anxiety. She reports that she tried 2 different jobs and left those jobs due to odd hours, weekends, and physically demanding jobs. She reports that she will start working at [employer] and is looking forward to this. Will be working 7:30 am-12:30 pm and will be working the same schedule as her husband. She reports that previous jobs resulted in altered sleep wake cycle.    She reports that she had some stress and anxiety with other jobs. Anxiety has been manageable since she left job. Has some mild anticipatory anxiety about new jobs. She reports that her mood has been ok. Denies depressed mood. Denies irritability. Has difficulty falling asleep and averages about 6 hours of sleep a night. Reports that she lost weight at her last job due to not getting a break in an 8 hour period and was staying on her feet. Energy and motivation have been ok. Concentration has been adequate. Denies SI.  Denies any change in ETOH use. She reports that she is not ready to change ETOH use at this time.   Typically only taking Gabapentin once daily.   Twin sister's husband was dx'd with Alzheimer's and sister is having difficulty with this. Sister lives behind her and comes to her for support. Daughter left a job that pt thought was causing her stress.   Past medication trials: Alprazolam Lamictal Cymbalta Gabapentin BuSpar Trileptal-ineffective Latuda-helped for several days and then signs and symptoms significantly worsened Abilify-concentration difficulties Hydroxyzine Propanolol Lorazepam  Review of Systems:  Review of Systems  Gastrointestinal: Negative.    Musculoskeletal: Negative for gait problem.  Neurological: Negative for tremors and headaches.  Psychiatric/Behavioral:       Please refer to HPI    Medications: I have reviewed the patient's current medications.  Current Outpatient Medications  Medication Sig Dispense Refill  . ALPRAZolam (XANAX) 1 MG tablet Take 1 tablet (1 mg total) by mouth at bedtime as needed for anxiety. 30 tablet 5  . busPIRone (BUSPAR) 15 MG tablet Take 15 mg by mouth daily.     . DULoxetine (CYMBALTA) 60 MG capsule TAKE 1 CAPSULE BY MOUTH EVERY DAY 90 capsule 1  . gabapentin (NEURONTIN) 300 MG capsule TAKE 1 CAPSULE BY MOUTH THREE TIMES A DAY 270 capsule 1  . lamoTRIgine (LAMICTAL) 150 MG tablet Take 1 tablet (150 mg total) by mouth daily. 90 tablet 1   No current facility-administered medications for this visit.    Medication Side Effects: None  Allergies:  Allergies  Allergen Reactions  . Penicillins     Childhood allergy    Past Medical History:  Diagnosis Date  . Discoid lupus   . Skin cancer     Family History  Problem Relation Age of Onset  . Bipolar disorder Father   . Schizophrenia Sister   . Bipolar disorder Sister   . Panic disorder Sister   . Bipolar disorder Paternal Grandmother   . Bipolar disorder Sister   . Bipolar disorder Sister   . Anxiety disorder Sister   . Bipolar disorder Sister   . Bipolar disorder Sister   . Anxiety disorder Daughter   .  Depression Daughter     Social History   Socioeconomic History  . Marital status: Married    Spouse name: Not on file  . Number of children: Not on file  . Years of education: Not on file  . Highest education level: Not on file  Occupational History  . Not on file  Tobacco Use  . Smoking status: Current Every Day Smoker    Types: Cigarettes    Start date: 12/02/1995  . Smokeless tobacco: Never Used  Substance and Sexual Activity  . Alcohol use: Yes  . Drug use: Not on file  . Sexual activity: Not on file  Other  Topics Concern  . Not on file  Social History Narrative  . Not on file   Social Determinants of Health   Financial Resource Strain:   . Difficulty of Paying Living Expenses: Not on file  Food Insecurity:   . Worried About Charity fundraiser in the Last Year: Not on file  . Ran Out of Food in the Last Year: Not on file  Transportation Needs:   . Lack of Transportation (Medical): Not on file  . Lack of Transportation (Non-Medical): Not on file  Physical Activity:   . Days of Exercise per Week: Not on file  . Minutes of Exercise per Session: Not on file  Stress:   . Feeling of Stress : Not on file  Social Connections:   . Frequency of Communication with Friends and Family: Not on file  . Frequency of Social Gatherings with Friends and Family: Not on file  . Attends Religious Services: Not on file  . Active Member of Clubs or Organizations: Not on file  . Attends Archivist Meetings: Not on file  . Marital Status: Not on file  Intimate Partner Violence:   . Fear of Current or Ex-Partner: Not on file  . Emotionally Abused: Not on file  . Physically Abused: Not on file  . Sexually Abused: Not on file    Past Medical History, Surgical history, Social history, and Family history were reviewed and updated as appropriate.   Please see review of systems for further details on the patient's review from today.   Objective:   Physical Exam:  There were no vitals taken for this visit.  Physical Exam Constitutional:      General: She is not in acute distress. Musculoskeletal:        General: No deformity.  Neurological:     Mental Status: She is alert and oriented to person, place, and time.     Coordination: Coordination normal.  Psychiatric:        Attention and Perception: Attention and perception normal. She does not perceive auditory or visual hallucinations.        Mood and Affect: Mood normal. Mood is not anxious or depressed. Affect is not labile, blunt, angry  or inappropriate.        Speech: Speech normal.        Behavior: Behavior normal.        Thought Content: Thought content normal. Thought content is not paranoid or delusional. Thought content does not include homicidal or suicidal ideation. Thought content does not include homicidal or suicidal plan.        Cognition and Memory: Cognition and memory normal.        Judgment: Judgment normal.     Comments: Insight intact     Lab Review:  No results found for: NA, K, CL, CO2, GLUCOSE, BUN,  CREATININE, CALCIUM, PROT, ALBUMIN, AST, ALT, ALKPHOS, BILITOT, GFRNONAA, GFRAA  No results found for: WBC, RBC, HGB, HCT, PLT, MCV, MCH, MCHC, RDW, LYMPHSABS, MONOABS, EOSABS, BASOSABS  No results found for: POCLITH, LITHIUM   No results found for: PHENYTOIN, PHENOBARB, VALPROATE, CBMZ   .res Assessment: Plan:   Continue Xanax 1mg  1/2 tab po BID prn anxiety.  Continue Buspar 15 mg po qd for anxiety.  Continue Cymbalta 60 mg po qd for depression and anxiety.  Continue Lamictal 150 mg po qd for mood s/s. Continue Gabapentin 300 mg po TID for anxiety.  Pt to follow-up in 6 months or sooner if clinically indicated.  Patient advised to contact office with any questions, adverse effects, or acute worsening in signs and symptoms.  Meleah was seen today for follow-up.  Diagnoses and all orders for this visit:  Generalized anxiety disorder -     ALPRAZolam (XANAX) 1 MG tablet; Take 1 tablet (1 mg total) by mouth at bedtime as needed for anxiety. 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     Please see After Visit Summary for patient specific instructions.  Future Appointments  Date Time Provider [place]  09/15/2020  3:30 PM Thayer Headings,  PMHNP CP-CP None    No orders of the defined types were placed in this encounter.   -------------------------------

## 2020-03-18 ENCOUNTER — Ambulatory Visit: Payer: 59 | Admitting: Psychiatry

## 2020-05-20 ENCOUNTER — Other Ambulatory Visit: Payer: Self-pay

## 2020-05-20 ENCOUNTER — Encounter: Payer: Self-pay | Admitting: Family Medicine

## 2020-05-20 ENCOUNTER — Ambulatory Visit (INDEPENDENT_AMBULATORY_CARE_PROVIDER_SITE_OTHER): Payer: 59 | Admitting: Family Medicine

## 2020-05-20 ENCOUNTER — Ambulatory Visit
Admission: RE | Admit: 2020-05-20 | Discharge: 2020-05-20 | Disposition: A | Discharge status: Home / Self Care | Payer: 59 | Source: Ambulatory Visit | Attending: Family Medicine | Admitting: Family Medicine

## 2020-05-20 VITALS — BP 150/76 | HR 83 | Temp 96.0°F | Ht 64.0 in | Wt 110.0 lb

## 2020-05-20 DIAGNOSIS — G629 Polyneuropathy, unspecified: Secondary | ICD-10-CM

## 2020-05-20 DIAGNOSIS — R0989 Other specified symptoms and signs involving the circulatory and respiratory systems: Secondary | ICD-10-CM | POA: Insufficient documentation

## 2020-05-20 DIAGNOSIS — F17210 Nicotine dependence, cigarettes, uncomplicated: Secondary | ICD-10-CM

## 2020-05-20 DIAGNOSIS — Z789 Other specified health status: Secondary | ICD-10-CM

## 2020-05-20 DIAGNOSIS — R292 Abnormal reflex: Secondary | ICD-10-CM | POA: Insufficient documentation

## 2020-05-20 DIAGNOSIS — L659 Nonscarring hair loss, unspecified: Secondary | ICD-10-CM

## 2020-05-20 DIAGNOSIS — R634 Abnormal weight loss: Secondary | ICD-10-CM | POA: Insufficient documentation

## 2020-05-20 DIAGNOSIS — R10811 Right upper quadrant abdominal tenderness: Secondary | ICD-10-CM

## 2020-05-20 DIAGNOSIS — R208 Other disturbances of skin sensation: Secondary | ICD-10-CM

## 2020-05-20 DIAGNOSIS — F1090 Alcohol use, unspecified, uncomplicated: Secondary | ICD-10-CM | POA: Insufficient documentation

## 2020-05-20 LAB — POCT URINALYSIS DIP (PROADVANTAGE DEVICE)
Bilirubin, UA: NEGATIVE
Blood, UA: NEGATIVE
Glucose, UA: NEGATIVE mg/dL
Ketones, POC UA: NEGATIVE mg/dL
Leukocytes, UA: NEGATIVE
Nitrite, UA: NEGATIVE
Protein Ur, POC: NEGATIVE mg/dL
Specific Gravity, Urine: 1.015
Urobilinogen, Ur: 0.2
pH, UA: 6 (ref 5.0–8.0)

## 2020-05-20 NOTE — Progress Notes (Signed)
Subjective:    Patient ID: Jacqueline Lang, female    DOB: 06-12-1960, 60 y.o.   MRN: [id number]  HPI Chief Complaint  Patient presents with  . buring and numbness in both feet    She is new to me and has not been seen in this practice since 2017.  States she has not had any medical care in years and no labs in more than 10 years.   Complains of numbness, tingling and burning in all of her toes bilaterally. This has been ongoing for 2 years but worsening over the past couple of months. States her right foot bothers her more than the left.  Pain and numbness are worse during the day when she is ambulating.  Pain is not keeping her awake at night.   She reports other symptoms such as unexplained weight loss (35 lbs in the past 6 months), hair thinning and night sweats for the past year.   Menopause early to mid 29s.   States she thinks she is getting plenty of calories. Drinks a Boost for breakfast and then eats lunch and dinner.   No abdominal pain, N/V/D or constipation.  No blood in her stool.   She has been on gabapentin and Cymbalta for mental health.  Thayer Headings at [facility] has been prescribing her medications.   States she started on a low dose aspirin a few days ago.   She has been smoking 2 ppd until this past weekend and she cut back to 15 cigarettes per day.   Alcohol abuse- drinks 2 bottles of wine per night. Stopped dirnking for 20 years and started back 5 years ago.   Married. 2 kids. Works at [employer].   Denies fever, chills, chest pain, palpitations, shortness of breath, abdominal pain, N/V/D, urinary symptoms, LE edema.   Reviewed allergies, medications, past medical, surgical, family, and social history.    Review of Systems Pertinent positives and negatives in the history of present illness.     Objective:   Physical Exam Constitutional:      General: She is not in acute distress.    Appearance: She is not ill-appearing.  Eyes:      Conjunctiva/sclera: Conjunctivae normal.     Pupils: Pupils are equal, round, and reactive to light.  Cardiovascular:     Rate and Rhythm: Normal rate and regular rhythm.     Heart sounds: Normal heart sounds.  Pulmonary:     Effort: Pulmonary effort is normal.     Breath sounds: Normal breath sounds.  Abdominal:     General: Abdomen is flat. Bowel sounds are normal. There is no distension.     Palpations: Abdomen is soft.     Tenderness: There is abdominal tenderness. There is no right CVA tenderness, left CVA tenderness, guarding or rebound.  Musculoskeletal:        General: Normal range of motion.     Cervical back: Normal range of motion and neck supple.     Right ankle: Normal.     Left ankle: Normal.     Right foot: Decreased capillary refill. Normal range of motion. No tenderness. Abnormal pulse.     Left foot: Normal range of motion. No tenderness. Abnormal pulse.     Comments: Right foot with purplish skin to her 1st and 2nd toes and MTP joints. Decreased cap refill, pulses and sensation.  Left foot with normal color, slightly decreased sensation and pulses.   Skin:    General: Skin is  warm and dry.  Neurological:     General: No focal deficit present.     Mental Status: She is alert and oriented to person, place, and time.     Cranial Nerves: No cranial nerve deficit.     Sensory: Sensory deficit present.     Motor: No weakness or tremor.     Gait: Gait normal.     Deep Tendon Reflexes: Reflexes abnormal.     Reflex Scores:      Tricep reflexes are 3+ on the right side and 3+ on the left side.      Bicep reflexes are 2+ on the right side and 2+ on the left side.      Brachioradialis reflexes are 2+ on the right side and 2+ on the left side.      Patellar reflexes are 3+ on the right side and 3+ on the left side.      Achilles reflexes are 3+ on the right side and 3+ on the left side.    Comments: No asterixis   Psychiatric:        Attention and Perception: Attention  normal.        Mood and Affect: Mood is anxious.        Speech: Speech normal.        Thought Content: Thought content normal. Thought content does not include homicidal or suicidal ideation.        Cognition and Memory: Cognition normal.    BP (!) 150/76   Pulse 83   Temp (!) 96 F (35.6 C)   Ht 5\' 4"  (1.626 m)   Wt 110 lb (49.9 kg)   SpO2 99%   BMI 18.88 kg/m       Assessment & Plan:  Unexplained weight loss - Plan: CBC with Differential/Platelet, Comprehensive metabolic panel, POCT Urinalysis DIP (Proadvantage Device), TSH, T4, free, T3, HIV Antibody (routine testing w rflx), Hemoglobin A1c, DG Chest 2 View  Neuropathy - Plan: TSH, T4, free, T3, Lipid panel, Iron, TIBC and Ferritin Panel, Vitamin B12, VAS [place] LOWER EXTREMITY ARTERIAL DUPLEX, VAS [place] LOWER EXTREMITY VENOUS (DVT)  Decreased pulses in feet - Plan: VAS [place] LOWER EXTREMITY ARTERIAL DUPLEX, VAS [place] LOWER EXTREMITY VENOUS (DVT)  Decreased sensation of foot - Plan: Iron, TIBC and Ferritin Panel, Vitamin B12, VAS [place] LOWER EXTREMITY ARTERIAL DUPLEX, VAS [place] LOWER EXTREMITY VENOUS (DVT)  Generalized hyperreflexia - Plan: CBC with Differential/Platelet, Comprehensive metabolic panel, TSH, T4, free, T3  Heavy alcohol use - Plan: Comprehensive metabolic panel, Hepatitis C antibody, VAS [place] LOWER EXTREMITY ARTERIAL DUPLEX, VAS [place] LOWER EXTREMITY VENOUS (DVT)  Heavy cigarette smoker - Plan: DG Chest 2 View, VAS [place] LOWER EXTREMITY ARTERIAL DUPLEX, VAS [place] LOWER EXTREMITY VENOUS (DVT)  Right upper quadrant abdominal tenderness without rebound tenderness - Plan: Comprehensive metabolic panel, Hepatitis C antibody  Hair thinning - Plan: TSH, T4, free, T3, Iron, TIBC and Ferritin Panel, Vitamin B12  She is a pleasant 60 year old female who is new to me. No regular medical care in years.  She is overdue for preventive healthcare but is not interested today in updating this. She has several concerns today including unexplained weight  loss, neuropathy in her toes and hair thinning. Her symptoms have been ongoing but worsening. She is currently drinking 2 bottles of wine per night and until 3 days ago was smoking 2 packs of cigarettes per day. She is high risk for vascular disease.  We discussed the potential for alcohol-related neuropathy. Venous and  arterial studies ordered. I did ask her if she was ready to stop smoking so that I could direct her for inpatient detox and therapy but she states she is not ready. Encourage smoking cessation. I will check labs, chest x-ray and follow-up.

## 2020-05-20 NOTE — Progress Notes (Signed)
No acute findings on her chest XR which is good.

## 2020-05-21 LAB — LIPID PANEL
Chol/HDL Ratio: 2.7 ratio (ref 0.0–4.4)
Cholesterol, Total: 222 mg/dL — ABNORMAL HIGH (ref 100–199)
HDL: 82 mg/dL (ref >39)
LDL Chol Calc (NIH): 126 mg/dL — ABNORMAL HIGH (ref 0–99)
Triglycerides: 80 mg/dL (ref 0–149)
VLDL Cholesterol Cal: 14 mg/dL (ref 5–40)

## 2020-05-21 LAB — COMPREHENSIVE METABOLIC PANEL
ALT: 18 IU/L (ref 0–32)
AST: 23 IU/L (ref 0–40)
Albumin/Globulin Ratio: 1.7 (ref 1.2–2.2)
Albumin: 4.7 g/dL (ref 3.8–4.8)
Alkaline Phosphatase: 105 IU/L (ref 44–121)
BUN/Creatinine Ratio: 26 (ref 12–28)
BUN: 16 mg/dL (ref 8–27)
Bilirubin Total: 0.3 mg/dL (ref 0.0–1.2)
CO2: 25 mmol/L (ref 20–29)
Calcium: 10.5 mg/dL — ABNORMAL HIGH (ref 8.7–10.3)
Chloride: 99 mmol/L (ref 96–106)
Creatinine, Ser: 0.62 mg/dL (ref 0.57–1.00)
GFR calc Af Amer: 113 mL/min/{1.73_m2} (ref >59)
GFR calc non Af Amer: 98 mL/min/{1.73_m2} (ref >59)
Globulin, Total: 2.7 g/dL (ref 1.5–4.5)
Glucose: 85 mg/dL (ref 65–99)
Potassium: 5.2 mmol/L (ref 3.5–5.2)
Sodium: 138 mmol/L (ref 134–144)
Total Protein: 7.4 g/dL (ref 6.0–8.5)

## 2020-05-21 LAB — CBC WITH DIFFERENTIAL/PLATELET
Basophils Absolute: 0.1 10*3/uL (ref 0.0–0.2)
Basos: 1 %
EOS (ABSOLUTE): 0.2 10*3/uL (ref 0.0–0.4)
Eos: 3 %
Hematocrit: 43.2 % (ref 34.0–46.6)
Hemoglobin: 14.9 g/dL (ref 11.1–15.9)
Immature Grans (Abs): 0 10*3/uL (ref 0.0–0.1)
Immature Granulocytes: 0 %
Lymphocytes Absolute: 2.5 10*3/uL (ref 0.7–3.1)
Lymphs: 28 %
MCH: 33.1 pg — ABNORMAL HIGH (ref 26.6–33.0)
MCHC: 34.5 g/dL (ref 31.5–35.7)
MCV: 96 fL (ref 79–97)
Monocytes Absolute: 0.9 10*3/uL (ref 0.1–0.9)
Monocytes: 10 %
Neutrophils Absolute: 5.1 10*3/uL (ref 1.4–7.0)
Neutrophils: 58 %
Platelets: 346 10*3/uL (ref 150–450)
RBC: 4.5 x10E6/uL (ref 3.77–5.28)
RDW: 12.1 % (ref 11.7–15.4)
WBC: 8.8 10*3/uL (ref 3.4–10.8)

## 2020-05-21 LAB — T4, FREE: Free T4: 1.17 ng/dL (ref 0.82–1.77)

## 2020-05-21 LAB — IRON,TIBC AND FERRITIN PANEL
Ferritin: 113 ng/mL (ref 15–150)
Iron Saturation: 16 % (ref 15–55)
Iron: 58 ug/dL (ref 27–139)
Total Iron Binding Capacity: 366 ug/dL (ref 250–450)
UIBC: 308 ug/dL (ref 118–369)

## 2020-05-21 LAB — VITAMIN B12: Vitamin B-12: 688 pg/mL (ref 232–1245)

## 2020-05-21 LAB — HEMOGLOBIN A1C
Est. average glucose Bld gHb Est-mCnc: 108 mg/dL
Hgb A1c MFr Bld: 5.4 % (ref 4.8–5.6)

## 2020-05-21 LAB — T3: T3, Total: 85 ng/dL (ref 71–180)

## 2020-05-21 LAB — TSH: TSH: 1.22 u[IU]/mL (ref 0.450–4.500)

## 2020-05-21 LAB — HEPATITIS C ANTIBODY: Hep C Virus Ab: <0.1 s/co ratio (ref 0.0–0.9)

## 2020-05-21 LAB — HIV ANTIBODY (ROUTINE TESTING W REFLEX): HIV Screen 4th Generation wRfx: NONREACTIVE

## 2020-05-22 ENCOUNTER — Ambulatory Visit (HOSPITAL_COMMUNITY)
Admission: RE | Admit: 2020-05-22 | Discharge: 2020-05-22 | Disposition: A | Discharge status: Home / Self Care | Payer: 59 | Source: Ambulatory Visit | Attending: Family Medicine | Admitting: Family Medicine

## 2020-05-22 ENCOUNTER — Other Ambulatory Visit: Payer: Self-pay

## 2020-05-22 DIAGNOSIS — G629 Polyneuropathy, unspecified: Secondary | ICD-10-CM | POA: Diagnosis present

## 2020-05-22 DIAGNOSIS — R0989 Other specified symptoms and signs involving the circulatory and respiratory systems: Secondary | ICD-10-CM | POA: Diagnosis not present

## 2020-05-22 DIAGNOSIS — R208 Other disturbances of skin sensation: Secondary | ICD-10-CM | POA: Insufficient documentation

## 2020-05-22 DIAGNOSIS — Z789 Other specified health status: Secondary | ICD-10-CM | POA: Diagnosis not present

## 2020-05-22 DIAGNOSIS — F1090 Alcohol use, unspecified, uncomplicated: Secondary | ICD-10-CM

## 2020-05-22 DIAGNOSIS — F17210 Nicotine dependence, cigarettes, uncomplicated: Secondary | ICD-10-CM | POA: Diagnosis present

## 2020-05-25 ENCOUNTER — Encounter: Payer: Self-pay | Admitting: Family Medicine

## 2020-05-25 DIAGNOSIS — I739 Peripheral vascular disease, unspecified: Secondary | ICD-10-CM

## 2020-05-25 HISTORY — DX: Peripheral vascular disease, unspecified: I73.9

## 2020-05-25 NOTE — Progress Notes (Signed)
Please put in a referral to vascular for moderate right lower extremity arterial disease. Let her know that I would like for her to see them for this issue. Her labs are unremarkable. We need to have her come back in very soon for a CPE to get her cancer screenings updated and to follow up on unintentional weight loss. I would like to discuss ordering a cat scan of her abdomen and pelvis at that visit as well.

## 2020-05-26 ENCOUNTER — Other Ambulatory Visit: Payer: Self-pay | Admitting: Internal Medicine

## 2020-05-26 DIAGNOSIS — I779 Disorder of arteries and arterioles, unspecified: Secondary | ICD-10-CM

## 2020-06-04 ENCOUNTER — Encounter: Payer: Self-pay | Admitting: Vascular Surgery

## 2020-06-04 ENCOUNTER — Other Ambulatory Visit: Payer: Self-pay

## 2020-06-04 ENCOUNTER — Ambulatory Visit (INDEPENDENT_AMBULATORY_CARE_PROVIDER_SITE_OTHER): Payer: 59 | Admitting: Vascular Surgery

## 2020-06-04 VITALS — BP 153/73 | HR 75 | Temp 98.1°F | Ht 64.0 in | Wt 113.8 lb

## 2020-06-04 DIAGNOSIS — I739 Peripheral vascular disease, unspecified: Secondary | ICD-10-CM

## 2020-06-04 NOTE — Progress Notes (Signed)
Referring Physician: Mack Hook NP  Patient name: Jacqueline Lang MRN: [id number] DOB: 1961/01/01 Sex: female  REASON FOR CONSULT: Peripheral arterial disease  HPI: Jacqueline SALMONS is a 60 y.o. female, with a several year history of burning numbness and tingling in both feet.  This has been progressively worse over the last few years.  She has been on Neurontin in the past but currently is only taking 100 mg once a day though prescribed 3 times a day.  He states she was originally placed on this for anxiety.  She does not really describe claudication symptoms in either leg.  She recently cut back on cigarettes from 2 packs a day to 1 pack a day.  She also drinks several bottles of wine daily.  She states that the burning in her toes is worse in the right foot than on the left.  She has no family history of early vascular disease.  She has never had any interventions on her blood vessels.  She has no family history of aneurysms.  She does not describe rest pain.  Other medical problems include anxiety and is currently under management with a counselor.  She is on aspirin.  She is not on a statin.  Past Medical History:  Diagnosis Date  . Discoid lupus   . Peripheral arterial disease ([place]) 05/25/2020  . Skin cancer    Past Surgical History:  Procedure Laterality Date  . OTHER SURGICAL HISTORY     Reports that appendix ruptured and had surgery to remove infection and that appendix was not removed    Family History  Problem Relation Age of Onset  . Heart disease Mother   . Bipolar disorder Father   . Schizophrenia Sister   . Bipolar disorder Sister   . Panic disorder Sister   . Bipolar disorder Paternal Grandmother   . Bipolar disorder Sister   . Bipolar disorder Sister   . Anxiety disorder Sister   . Bipolar disorder Sister   . Bipolar disorder Sister   . Anxiety disorder Daughter   . Depression Daughter     SOCIAL HISTORY: Social History   Socioeconomic History  . Marital  status: Married    Spouse name: Not on file  . Number of children: Not on file  . Years of education: Not on file  . Highest education level: Not on file  Occupational History  . Not on file  Tobacco Use  . Smoking status: Current Every Day Smoker    Packs/day: 1.00    Years: 20.00    Pack years: 20.00    Types: Cigarettes    Start date: 12/02/1995  . Smokeless tobacco: Never Used  Substance and Sexual Activity  . Alcohol use: Yes    Comment: 2 bottles wine a day  . Drug use: Not on file  . Sexual activity: Not on file  Other Topics Concern  . Not on file  Social History Narrative  . Not on file   Social Determinants of Health   Financial Resource Strain: Not on file  Food Insecurity: Not on file  Transportation Needs: Not on file  Physical Activity: Not on file  Stress: Not on file  Social Connections: Not on file  Intimate Partner Violence: Not on file    Allergies  Allergen Reactions  . Penicillins     Childhood allergy    Current Outpatient Medications  Medication Sig Dispense Refill  . ALPRAZolam (XANAX) 1 MG tablet Take 1 tablet (1  mg total) by mouth at bedtime as needed for anxiety. 30 tablet 5  . aspirin 81 MG EC tablet Take 81 mg by mouth daily. Swallow whole.    . busPIRone (BUSPAR) 15 MG tablet Take 15 mg by mouth daily.     . DULoxetine (CYMBALTA) 60 MG capsule TAKE 1 CAPSULE BY MOUTH EVERY DAY 90 capsule 1  . gabapentin (NEURONTIN) 300 MG capsule TAKE 1 CAPSULE BY MOUTH THREE TIMES A DAY (Patient taking differently: 300 mg once. TAKE 1 CAPSULE BY MOUTH THREE TIMES A DAY) 270 capsule 1  . lamoTRIgine (LAMICTAL) 150 MG tablet Take 1 tablet (150 mg total) by mouth daily. 90 tablet 1  . Multiple Vitamin (MULTIVITAMIN) tablet Take 1 tablet by mouth daily.     No current facility-administered medications for this visit.    ROS:   General:  No weight loss, Fever, chills  HEENT: No recent headaches, no nasal bleeding, no visual changes, no sore  throat  Neurologic: No dizziness, blackouts, seizures. No recent symptoms of stroke or mini- stroke. No recent episodes of slurred speech, or temporary blindness.  Cardiac: No recent episodes of chest pain/pressure, no shortness of breath at rest.  No shortness of breath with exertion.  Denies history of atrial fibrillation or irregular heartbeat  Vascular: No history of rest pain in feet.  No history of claudication.  No history of non-healing ulcer, No history of DVT   Pulmonary: No home oxygen, no productive cough, no hemoptysis,  No asthma or wheezing  Musculoskeletal:  [ ]  Arthritis, [ ]  Low back pain,  [ ]  Joint pain  Hematologic:No history of hypercoagulable state.  No history of easy bleeding.  No history of anemia  Gastrointestinal: No hematochezia or melena,  No gastroesophageal reflux, no trouble swallowing  Urinary: [ ]  chronic Kidney disease, [ ]  on HD - [ ]  MWF or [ ]  TTHS, [ ]  Burning with urination, [ ]  Frequent urination, [ ]  Difficulty urinating;   Skin: No rashes  Psychological: No history of anxiety,  No history of depression   Physical Examination  Vitals:   06/04/20 0819  BP: (!) 153/73  Pulse: 75  Temp: 98.1 F (36.7 C)  TempSrc: Skin  SpO2: 99%  Weight: 113 lb 12.8 oz (51.6 kg)  Height: 5\' 4"  (1.626 m)    Body mass index is 19.53 kg/m.  General:  Alert and oriented, no acute distress HEENT: Normal Neck: No JVD Cardiac: Regular Rate and Rhythm Abdomen: Soft, non-tender, non-distended, no mass Skin: No rash Extremity Pulses:  2+ radial, brachial, femoral, 2+ left popliteal absent right popliteal absent dorsalis pedis, posterior tibial pulses bilaterally Musculoskeletal: No deformity or edema  Neurologic: Upper and lower extremity motor 5/5 and symmetric  DATA:  Patient had bilateral ABIs performed on May 22, 2020.  I reviewed and interpreted the studies today.  Right leg had an ABI of 0.7 with biphasic waveforms.  Left leg had an ABI of  0.96 with biphasic waveforms.  ASSESSMENT: Peripheral arterial disease.  Patient's current symptoms are more related to peripheral neuropathy rather than arterial occlusive disease.  She does have adequate perfusion to her right lower extremity and is not currently at risk of limb loss.  I discussed with her today the pathophysiology of peripheral arterial disease as well as peripheral neuropathy.  Certainly peripheral neuropathy can be caused by peripheral arterial disease but she also has other possibilities for the etiology of this.  She was reassured today that she is not  currently at risk of limb loss and that if she can quit smoking her risk of limb loss lifetime is less than 5%.   PLAN: Patient was counseled to quit smoking and decrease or eliminate alcohol use  She is going to consider taking her Neurontin 3 times a day rather than once a day.  She will have a follow-up visit with me in 3 months time with repeat ABIs and arterial duplex exam.  Signs and symptoms of rest pain nonhealing wounds claudication symptoms were discussed with the patient today.  She will call me if she experiences any of these symptoms sooner.  Consideration for starting a statin will leave at discretion of her primary providers   Ruta Hinds, MD [facility] Office: [phone]

## 2020-06-08 ENCOUNTER — Other Ambulatory Visit: Payer: Self-pay

## 2020-06-08 DIAGNOSIS — I739 Peripheral vascular disease, unspecified: Secondary | ICD-10-CM

## 2020-09-03 ENCOUNTER — Ambulatory Visit: Payer: 59 | Admitting: Vascular Surgery

## 2020-09-03 ENCOUNTER — Ambulatory Visit (HOSPITAL_COMMUNITY): Admission: RE | Admit: 2020-09-03 | Payer: 59 | Source: Ambulatory Visit

## 2020-09-03 ENCOUNTER — Ambulatory Visit (HOSPITAL_COMMUNITY): Payer: 59 | Attending: Vascular Surgery

## 2020-09-12 ENCOUNTER — Other Ambulatory Visit: Payer: Self-pay | Admitting: Psychiatry

## 2020-09-12 DIAGNOSIS — F3176 Bipolar disorder, in full remission, most recent episode depressed: Secondary | ICD-10-CM

## 2020-09-15 ENCOUNTER — Ambulatory Visit: Payer: 59 | Admitting: Psychiatry

## 2020-09-16 ENCOUNTER — Other Ambulatory Visit: Payer: Self-pay | Admitting: Psychiatry

## 2020-09-16 DIAGNOSIS — F3176 Bipolar disorder, in full remission, most recent episode depressed: Secondary | ICD-10-CM

## 2020-09-16 DIAGNOSIS — F411 Generalized anxiety disorder: Secondary | ICD-10-CM

## 2020-09-17 NOTE — Telephone Encounter (Signed)
Please schedule appt

## 2020-09-19 ENCOUNTER — Other Ambulatory Visit: Payer: Self-pay | Admitting: Psychiatry

## 2020-09-19 DIAGNOSIS — F411 Generalized anxiety disorder: Secondary | ICD-10-CM

## 2020-09-21 ENCOUNTER — Ambulatory Visit (INDEPENDENT_AMBULATORY_CARE_PROVIDER_SITE_OTHER): Payer: 59 | Admitting: Family Medicine

## 2020-09-21 ENCOUNTER — Encounter: Payer: Self-pay | Admitting: Family Medicine

## 2020-09-21 VITALS — BP 110/68 | HR 78 | Temp 96.0°F | Ht 64.5 in | Wt 106.4 lb

## 2020-09-21 DIAGNOSIS — Z23 Encounter for immunization: Secondary | ICD-10-CM

## 2020-09-21 DIAGNOSIS — R197 Diarrhea, unspecified: Secondary | ICD-10-CM

## 2020-09-21 LAB — COMPREHENSIVE METABOLIC PANEL
ALT: 24 IU/L (ref 0–32)
AST: 27 IU/L (ref 0–40)
Albumin/Globulin Ratio: 1.5 (ref 1.2–2.2)
Albumin: 4.7 g/dL (ref 3.8–4.8)
Alkaline Phosphatase: 110 IU/L (ref 44–121)
BUN/Creatinine Ratio: 21 (ref 12–28)
BUN: 13 mg/dL (ref 8–27)
Bilirubin Total: 0.2 mg/dL (ref 0.0–1.2)
CO2: 21 mmol/L (ref 20–29)
Calcium: 10.2 mg/dL (ref 8.7–10.3)
Chloride: 100 mmol/L (ref 96–106)
Creatinine, Ser: 0.62 mg/dL (ref 0.57–1.00)
Globulin, Total: 3.2 g/dL (ref 1.5–4.5)
Glucose: 81 mg/dL (ref 65–99)
Potassium: 5 mmol/L (ref 3.5–5.2)
Sodium: 138 mmol/L (ref 134–144)
Total Protein: 7.9 g/dL (ref 6.0–8.5)
eGFR: 101 mL/min/{1.73_m2} (ref >59)

## 2020-09-21 LAB — CBC WITH DIFFERENTIAL/PLATELET
Basophils Absolute: 0.1 10*3/uL (ref 0.0–0.2)
Basos: 1 %
EOS (ABSOLUTE): 0.3 10*3/uL (ref 0.0–0.4)
Eos: 3 %
Hematocrit: 44.2 % (ref 34.0–46.6)
Hemoglobin: 14.9 g/dL (ref 11.1–15.9)
Immature Grans (Abs): 0 10*3/uL (ref 0.0–0.1)
Immature Granulocytes: 0 %
Lymphocytes Absolute: 2.9 10*3/uL (ref 0.7–3.1)
Lymphs: 27 %
MCH: 33.6 pg — ABNORMAL HIGH (ref 26.6–33.0)
MCHC: 33.7 g/dL (ref 31.5–35.7)
MCV: 100 fL — ABNORMAL HIGH (ref 79–97)
Monocytes Absolute: 0.8 10*3/uL (ref 0.1–0.9)
Monocytes: 8 %
Neutrophils Absolute: 6.6 10*3/uL (ref 1.4–7.0)
Neutrophils: 61 %
Platelets: 401 10*3/uL (ref 150–450)
RBC: 4.44 x10E6/uL (ref 3.77–5.28)
RDW: 12.5 % (ref 11.7–15.4)
WBC: 10.8 10*3/uL (ref 3.4–10.8)

## 2020-09-21 NOTE — Progress Notes (Signed)
   Subjective:    Patient ID: Jacqueline Lang, female    DOB: 1960-07-28, 60 y.o.   MRN: [id number]  HPI She complains of a 34-month history of difficulty with loose watery stools having 3 to 5/day.  She has not done any traveling been on antibiotics, changed in any of her medications.  No fever, chills, nausea or vomiting.  She does drink well water but has done so for years.  She is being treated by psychiatry for bipolar disorder which seems to be stable.  She did not think this is necessarily stress related Review of the record indicates he does need follow-up on some of her shots.  Review of Systems     Objective:   Physical Exam Alert and in no distress. Tympanic membranes and canals are normal. Pharyngeal area is normal. Neck is supple without adenopathy or thyromegaly. Cardiac exam shows a regular sinus rhythm without murmurs or gallops. Lungs are clear to auscultation.        Assessment & Plan:  Diarrhea, unspecified type - Plan: CBC with Differential/Platelet, Comprehensive metabolic panel, Cdiff NAA+O+P+Stool Culture, Cdiff NAA+O+P+Stool Culture  Need for shingles vaccine - Plan: Varicella-zoster vaccine IM  Need for Tdap vaccination - Plan: Tdap vaccine greater than or equal to 7yo IM I discussed her diarrhea in terms of making sure that its not an infectious nature and if this is negative, I will refer to GI for further evaluation and treatment.

## 2020-09-22 NOTE — Telephone Encounter (Signed)
Last filled 08/20/20 appt on 11/04/20

## 2020-09-25 ENCOUNTER — Other Ambulatory Visit: Payer: Self-pay

## 2020-09-25 DIAGNOSIS — R197 Diarrhea, unspecified: Secondary | ICD-10-CM

## 2020-09-26 LAB — CDIFF NAA+O+P+STOOL CULTURE
E coli, Shiga toxin Assay: NEGATIVE
E coli, Shiga toxin Assay: NEGATIVE
Toxigenic C. Difficile by PCR: NEGATIVE
Toxigenic C. Difficile by PCR: NEGATIVE

## 2020-10-17 ENCOUNTER — Other Ambulatory Visit: Payer: Self-pay | Admitting: Psychiatry

## 2020-10-17 DIAGNOSIS — F3176 Bipolar disorder, in full remission, most recent episode depressed: Secondary | ICD-10-CM

## 2020-10-20 ENCOUNTER — Ambulatory Visit: Payer: 59 | Admitting: Psychiatry

## 2020-10-22 ENCOUNTER — Other Ambulatory Visit: Payer: Self-pay | Admitting: Psychiatry

## 2020-10-22 DIAGNOSIS — F411 Generalized anxiety disorder: Secondary | ICD-10-CM

## 2020-10-22 DIAGNOSIS — F3176 Bipolar disorder, in full remission, most recent episode depressed: Secondary | ICD-10-CM

## 2020-11-04 ENCOUNTER — Ambulatory Visit: Payer: 59 | Admitting: Psychiatry

## 2020-11-11 ENCOUNTER — Ambulatory Visit (INDEPENDENT_AMBULATORY_CARE_PROVIDER_SITE_OTHER): Payer: 59 | Admitting: Gastroenterology

## 2020-11-11 ENCOUNTER — Other Ambulatory Visit (INDEPENDENT_AMBULATORY_CARE_PROVIDER_SITE_OTHER): Payer: 59

## 2020-11-11 ENCOUNTER — Encounter: Payer: Self-pay | Admitting: Gastroenterology

## 2020-11-11 VITALS — BP 118/64 | HR 88 | Ht 64.5 in | Wt 106.0 lb

## 2020-11-11 DIAGNOSIS — F1020 Alcohol dependence, uncomplicated: Secondary | ICD-10-CM

## 2020-11-11 DIAGNOSIS — R634 Abnormal weight loss: Secondary | ICD-10-CM

## 2020-11-11 DIAGNOSIS — R197 Diarrhea, unspecified: Secondary | ICD-10-CM | POA: Diagnosis not present

## 2020-11-11 LAB — HEPATIC FUNCTION PANEL
ALT: 22 U/L (ref 0–35)
AST: 25 U/L (ref 0–37)
Albumin: 4.3 g/dL (ref 3.5–5.2)
Alkaline Phosphatase: 102 U/L (ref 39–117)
Bilirubin, Direct: 0.1 mg/dL (ref 0.0–0.3)
Total Bilirubin: 0.4 mg/dL (ref 0.2–1.2)
Total Protein: 7.4 g/dL (ref 6.0–8.3)

## 2020-11-11 MED ORDER — NA SULFATE-K SULFATE-MG SULF 17.5-3.13-1.6 GM/177ML PO SOLN: 1.0000 | Freq: Once | ORAL | 0 refills | Status: AC

## 2020-11-11 NOTE — Progress Notes (Signed)
HPI: [name] is a 60 year old female who was referred to gastroenterology for further evaluation of chronic diarrhea and unintentional weight loss.  Patient states that she had abrupt onset of her diarrhea 4 months ago.  She denies any previous history of chronic or intermittent diarrhea.  Since the diarrhea began, it has been persistent but not worsening.  On average, she will have 4-5 bowel movements per day.  Her stools consistently Bristol scale 7 or 6.  Not think she has had a formed stool in the past 4 months.  She has some urgency but no incontinence.  Her bowel movements are typically postprandial but she also has been having 1 nocturnal bowel movement per night for the past few weeks.  She denies any symptoms of abdominal pain, nausea or vomiting.  She denies any blood in the stool.  During this time, she has also experienced unintentional weight loss.  She states her normal weight has been around 117 -120 pounds.  In the first year of COVID she gained about 20 pounds, but now she is down to 103 pounds.  She denies any change in her appetite or in her diet.  She denies any changes in her medications around this time. She does admit to fatigue and some burning pain in her bilateral toes, but otherwise denies any other symptoms.  Specifically, she denies any history of skin rashes, arthralgias, oral ulcers or sores, eye symptoms or genitourinary symptoms.  Prior to the onset of symptoms, she denies any travel or exposure to unusual food or water sources. Evaluation for diarrhea thus far has consisted of essentially unremarkable CHEM panel and CBC (mild macrocytosis) and negative stool culture/O&P/C. difficile The patient does struggle with anxiety and has a history of alcohol abuse as a means of coping with this anxiety.  She had achieved sobriety previously but then started drinking again a few years ago after the death of a relative.  She is aware that her drinking is a problem and eventually wants to  quit.  However, she is not sure if she is ready or able to stop drinking at this time.   Past Medical History:  Diagnosis Date   Discoid lupus    Peripheral arterial disease ([place]) 05/25/2020   Skin cancer     Past Surgical History:  Procedure Laterality Date   OTHER SURGICAL HISTORY     Reports that appendix ruptured and had surgery to remove infection and that appendix was not removed    Outpatient Medications Prior to Visit  Medication Sig Dispense Refill   ALPRAZolam (XANAX) 1 MG tablet TAKE 1 TABLET BY MOUTH AT BEDTIME AS NEEDED FOR ANXIETY. 30 tablet 1   aspirin 81 MG EC tablet Take 81 mg by mouth daily. Swallow whole.     busPIRone (BUSPAR) 15 MG tablet Take 15 mg by mouth daily.      DULoxetine (CYMBALTA) 60 MG capsule TAKE 1 CAPSULE BY MOUTH EVERY DAY 90 capsule 0   gabapentin (NEURONTIN) 300 MG capsule TAKE 1 CAPSULE BY MOUTH THREE TIMES A DAY 270 capsule 0   lamoTRIgine (LAMICTAL) 150 MG tablet TAKE 1 TABLET BY MOUTH EVERY DAY 30 tablet 0   Multiple Vitamin (MULTIVITAMIN) tablet Take 1 tablet by mouth daily.     No facility-administered medications prior to visit.    Allergies  Allergen Reactions   Penicillins     Childhood allergy    Family History  Problem Relation Age of Onset   Heart disease Mother  Bipolar disorder Father    Schizophrenia Sister    Bipolar disorder Sister    Panic disorder Sister    Bipolar disorder Paternal Grandmother    Bipolar disorder Sister    Bipolar disorder Sister    Anxiety disorder Sister    Bipolar disorder Sister    Bipolar disorder Sister    Anxiety disorder Daughter    Depression Daughter     Social History   Tobacco Use   Smoking status: Every Day    Packs/day: 1.00    Years: 20.00    Pack years: 20.00    Types: Cigarettes    Start date: 12/02/1995   Smokeless tobacco: Never  Vaping Use   Vaping Use: Never used  Substance Use Topics   Alcohol use: Yes    Comment: 2 bottles wine a day    ROS: As per  history of present illness, otherwise negative  BP 118/64   Pulse 88   Ht 5' 4.5" (1.638 m)   Wt 106 lb (48.1 kg)   SpO2 99%   BMI 17.91 kg/m  Constitutional: Well-developed and well-nourished. No distress. HEENT: Normocephalic and atraumatic. Oropharynx is clear and moist.  No scleral icterus. Cardiovascular: Normal rate, regular rhythm and intact distal pulses. No M/R/G Pulmonary/chest: Effort normal and breath sounds normal. No wheezing, rales or rhonchi. Abdominal: Soft, nontender, nondistended. Bowel sounds hyperactive throughout. There are no masses palpable. No hepatosplenomegaly. Extremities: no clubbing, cyanosis, or edema Neurological: Alert and oriented to person place and time.  Normal gait Skin: Skin is warm and dry.  Multiple cherry hemangiomas, no spider hemangiomas/telangiectasias Psychiatric: Normal mood and affect. Behavior is normal.  RELEVANT LABS AND IMAGING: CBC    Component Value Date/Time   WBC 10.8 09/21/2020 0944   RBC 4.44 09/21/2020 0944   HGB 14.9 09/21/2020 0944   HCT 44.2 09/21/2020 0944   PLT 401 09/21/2020 0944   MCV 100 (H) 09/21/2020 0944   MCH 33.6 (H) 09/21/2020 0944   MCHC 33.7 09/21/2020 0944   RDW 12.5 09/21/2020 0944   LYMPHSABS 2.9 09/21/2020 0944   EOSABS 0.3 09/21/2020 0944   BASOSABS 0.1 09/21/2020 0944    CMP     Component Value Date/Time   NA 138 09/21/2020 0944   K 5.0 09/21/2020 0944   CL 100 09/21/2020 0944   CO2 21 09/21/2020 0944   GLUCOSE 81 09/21/2020 0944   BUN 13 09/21/2020 0944   CREATININE 0.62 09/21/2020 0944   CALCIUM 10.2 09/21/2020 0944   PROT 7.4 11/11/2020 1112   PROT 7.9 09/21/2020 0944   ALBUMIN 4.3 11/11/2020 1112   ALBUMIN 4.7 09/21/2020 0944   AST 25 11/11/2020 1112   ALT 22 11/11/2020 1112   ALKPHOS 102 11/11/2020 1112   BILITOT 0.4 11/11/2020 1112   BILITOT 0.2 09/21/2020 0944   GFRNONAA 98 05/20/2020 1133   GFRAA 113 05/20/2020 1133  Results for Jacqueline Lang (MRN [id number]) as of  11/11/2020 17:07  Ref. Range 09/22/2020 13:39  Toxigenic C. Difficile by PCR Latest Ref Range: Negative  Negative   Component Ref Range & Units 1 mo ago  (09/22/20) 1 mo ago  (09/22/20)  Salmonella/Shigella Screen  Final report  Final report   Stool Culture result 1 (RSASHR)  Comment  Comment CM   Comment: No Salmonella or Shigella recovered.  Campylobacter Culture  Final report  Final report   Stool Culture result 1 (CMPCXR)  Comment  Comment CM   Comment: No Campylobacter species isolated.  E  coli, Shiga toxin Assay Negative Negative  Negative   OVA + PARASITE EXAM  Final report  Final report CM   Comment: These results were obtained using wet preparation(s) and trichrome  stained smear. This test does not include testing for Cryptosporidium  parvum, Cyclospora, or Microsporidia.   O&P result 1  Comment  Comment CM   Comment: No ova, cysts, or parasites seen.  One negative specimen does not rule out the possibility of a  parasitic infection.   Toxigenic C. Difficile by PCR Negative Negative  Negative   Resulting Agency  [facility]            ASSESSMENT/PLAN: 60 year old female with history of anxiety, chronic tobacco use and alcohol use disorder 4 months of persistent watery nonbloody diarrhea with associated 20 pound weight loss.  Low suspicion for an inflammatory diarrhea given the absence of abdominal pain and absence of bloody stool.  She has risk factors for microscopic colitis and celiac disease although celiac disease seems less likely given the nature of her symptoms.  Parasitic infection also possible although she does not have risk factors for this and she is already had 1 negative ova and parasite assay.  Given her alcoholism, nutritional deficiencies may also be at play.  Diarrhea/weight loss  - EGD/Colo with gastric, duodenal and colonic biopsies  - TTG/IgA  - Fecal leukocytes  - Repeat O&P -  Recommend Imodium PRN to be used in situations when bowel movements  not convenient.   Alcohol use disorder We discussed her alcohol use and the significant risk that she is taking with her current level consumption.  I informed her that females are much higher risk for developing alcoholic liver disease, particularly females of small stature.  She is very high risk for developing alcoholic hepatitis if she continues to drink her current level (2 bottles of wine per day).  It is reassuring that her liver enzymes are normal as are her platelets.  However her mild macrocytosis does indicate that alcohol is having an effect.  Her psychiatrist is aware of her alcohol use and its role in helping her cope with her anxiety and she is working with her psychiatrist to develop better coping mechanisms.  I recommended we obtain an ultrasound of her liver to assess for steatosis as this may provide further motivation for her to curb her drinking.  We will further discuss alcohol cessation at her follow-up visit further - RUQUS - Repeat LAEs   XH:[id number], Jacqueline Lang, [address] [place],  [place] [address]

## 2020-11-11 NOTE — Patient Instructions (Signed)
If you are age 62 or older, your body mass index should be between 23-30. Your Body mass index is 17.91 kg/m. If this is out of the aforementioned range listed, please consider follow up with your Primary Care Provider.  If you are age 42 or younger, your body mass index should be between 19-25. Your Body mass index is 17.91 kg/m. If this is out of the aformentioned range listed, please consider follow up with your Primary Care Provider.   Your provider has requested that you go to the basement level for lab work before leaving today. Press "B" on the elevator. The lab is located at the first door on the left as you exit the elevator.  You have been scheduled for an endoscopy and colonoscopy. Please follow the written instructions given to you at your visit today. Please pick up your prep supplies at the pharmacy within the next 1-3 days. If you use inhalers (even only as needed), please bring them with you on the day of your procedure.  You will be contacted by [facility] in the next 2 days to arrange a Ultrasound.  The number on your caller ID will be [phone], please answer when they call.  If you have not heard from them in 2 days please call [phone] to schedule.     The [place] [facility] providers would like to encourage you to use [facility] to communicate with providers for non-urgent requests or questions.  Due to long hold times on the telephone, sending your provider a message by [facility] may be a faster and more efficient way to get a response.  Please allow 48 business hours for a response.  Please remember that this is for non-urgent requests.   It was a pleasure to see you today!  Thank you for trusting me with your gastrointestinal care!     Scott E. Candis Schatz, MD

## 2020-11-12 LAB — IGA: Immunoglobulin A: 395 mg/dL — ABNORMAL HIGH (ref 70–320)

## 2020-11-12 LAB — TISSUE TRANSGLUTAMINASE, IGA: (tTG) Ab, IgA: <1 U/mL

## 2020-11-17 ENCOUNTER — Other Ambulatory Visit: Payer: 59

## 2020-11-17 ENCOUNTER — Other Ambulatory Visit: Payer: Self-pay

## 2020-11-17 ENCOUNTER — Ambulatory Visit (HOSPITAL_COMMUNITY)
Admission: RE | Admit: 2020-11-17 | Discharge: 2020-11-17 | Disposition: A | Discharge status: Home / Self Care | Payer: 59 | Source: Ambulatory Visit | Attending: Gastroenterology | Admitting: Gastroenterology

## 2020-11-17 ENCOUNTER — Other Ambulatory Visit: Payer: Self-pay | Admitting: Adult Health

## 2020-11-17 DIAGNOSIS — R197 Diarrhea, unspecified: Secondary | ICD-10-CM

## 2020-11-17 DIAGNOSIS — K8681 Exocrine pancreatic insufficiency: Secondary | ICD-10-CM

## 2020-11-17 DIAGNOSIS — F1020 Alcohol dependence, uncomplicated: Secondary | ICD-10-CM | POA: Diagnosis present

## 2020-11-17 DIAGNOSIS — R634 Abnormal weight loss: Secondary | ICD-10-CM | POA: Insufficient documentation

## 2020-11-17 DIAGNOSIS — F3176 Bipolar disorder, in full remission, most recent episode depressed: Secondary | ICD-10-CM

## 2020-11-23 LAB — OVA AND PARASITE EXAMINATION
CONCENTRATE RESULT:: NONE SEEN
MICRO NUMBER:: 12108900
SPECIMEN QUALITY:: ADEQUATE
TRICHROME RESULT:: NONE SEEN

## 2020-11-23 LAB — FECAL LACTOFERRIN, QUANT
Fecal Lactoferrin: NEGATIVE
MICRO NUMBER:: 12109139
SPECIMEN QUALITY:: ADEQUATE

## 2020-11-23 LAB — PANCREATIC ELASTASE, FECAL: Pancreatic Elastase-1, Stool: 121 mcg/g — ABNORMAL LOW

## 2020-11-24 ENCOUNTER — Telehealth: Payer: Self-pay

## 2020-11-24 MED ORDER — PANCRELIPASE (LIP-PROT-AMYL) 36000-114000 UNITS PO CPEP: ORAL_CAPSULE | ORAL | 11 refills | Status: DC

## 2020-11-24 NOTE — Telephone Encounter (Signed)
Left message for patient to please call back. 

## 2020-11-24 NOTE — Telephone Encounter (Signed)
-----   Message from Daryel November, MD sent at 11/24/2020  3:22 PM EDT ----- Pt's labs were notable for a low fecal elastase.  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  Will treat with Creon, 1 cap with meals and with snacks.  She is scheduled for her endoscopies on Aug 6th. Her [place] did not show any evidence of steatosis of significant fibrosis, nevertheless she should stop smoking and drinking in order to reduce further harm to her pancreas.   She will follow up with me in clinic following her endoscopies to further discuss management of EPI and further alcohol counseling. Sheri,, would you be able to assist Ms. Eaker in obtaining the Creon through her insurance provider?  If other PERT formulations are covered, we can go with those.  Also, can you assist with scheduling a follow up clinic appointment?

## 2020-11-25 ENCOUNTER — Other Ambulatory Visit: Payer: Self-pay

## 2020-11-25 MED ORDER — ZENPEP 40000-126000 UNITS PO CPEP: ORAL_CAPSULE | ORAL | 11 refills | Status: AC

## 2020-11-26 ENCOUNTER — Other Ambulatory Visit: Payer: Self-pay | Admitting: Psychiatry

## 2020-11-26 DIAGNOSIS — F411 Generalized anxiety disorder: Secondary | ICD-10-CM

## 2020-11-30 ENCOUNTER — Telehealth: Payer: Self-pay | Admitting: Gastroenterology

## 2020-11-30 ENCOUNTER — Other Ambulatory Visit: Payer: Self-pay

## 2020-11-30 NOTE — Telephone Encounter (Signed)
Inbound call from patient. Requesting a change in prescription Zenpep. States it is too expensive.

## 2020-12-01 NOTE — Telephone Encounter (Signed)
Left Vm for patient to call me back. I have samples and working on the patient assistance program,

## 2020-12-01 NOTE — Telephone Encounter (Signed)
Spoke with patient she will come by to pick up samples after work.

## 2020-12-03 ENCOUNTER — Encounter: Payer: Self-pay | Admitting: Psychiatry

## 2020-12-03 ENCOUNTER — Ambulatory Visit (INDEPENDENT_AMBULATORY_CARE_PROVIDER_SITE_OTHER): Payer: 59 | Admitting: Psychiatry

## 2020-12-03 ENCOUNTER — Other Ambulatory Visit: Payer: Self-pay

## 2020-12-03 DIAGNOSIS — F3176 Bipolar disorder, in full remission, most recent episode depressed: Secondary | ICD-10-CM | POA: Diagnosis not present

## 2020-12-03 DIAGNOSIS — F411 Generalized anxiety disorder: Secondary | ICD-10-CM

## 2020-12-03 MED ORDER — DULOXETINE HCL 60 MG PO CPEP: 60.0000 mg | ORAL_CAPSULE | Freq: Every day | ORAL | 1 refills | Status: DC

## 2020-12-03 MED ORDER — ALPRAZOLAM 1 MG PO TABS: ORAL_TABLET | ORAL | 5 refills | Status: DC

## 2020-12-03 MED ORDER — BUSPIRONE HCL 15 MG PO TABS: 15.0000 mg | ORAL_TABLET | Freq: Every day | ORAL | 1 refills | Status: DC

## 2020-12-03 MED ORDER — LAMOTRIGINE 150 MG PO TABS
150.0000 mg | ORAL_TABLET | Freq: Every day | ORAL | 1 refills | Status: DC
Start: 2020-12-03 — End: 2020-12-15

## 2020-12-03 MED ORDER — GABAPENTIN 300 MG PO CAPS: 300.0000 mg | ORAL_CAPSULE | Freq: Three times a day (TID) | ORAL | 1 refills | Status: DC

## 2020-12-03 NOTE — Progress Notes (Signed)
[name] [id number] 06-20-1960 60 y.o.  Subjective:   Patient ID:  Jacqueline Lang is a 60 y.o. (DOB 1960-09-24) female.  Chief Complaint:  Chief Complaint  Patient presents with   Follow-up    Anxiety, mood disturbance, and insomnia    HPI Jacqueline Lang presents to the office today for follow-up of anxiety, mood disturbance, and insomnia. She reports that she has had increased stress. Recently coordinated grandson's wedding and this was stressful. She reports that she has also been stressed about medical issues. Denies panic attacks. Reports frequent worry. She reports that her mood was elevated around grandson's wedding and had excessive goal-directed activity and increased spending. She reports that this resolved after the wedding. She denies depressed mood other than occasionally thinking about the loss of her father. She reports that she sleeps 5-6 hours a night and takes a nap after work. She reports fatigue and attributes this to not getting adequate nutrition. Motivation is higher in the morning and declines later in the day. No longer doing extensive cleaning. Concentration is adequate. Denies SI.   She reports that she has been drinking a large bottle of wine daily and is aware that alcohol is contributing to current GI issues.  Discussed cutting down on alcohol use and she reports, "I have thought about it, but I am not ready to quit."   She works part-time at [employer] 3-4 days a week for about 3-4 hours.    Past medication trials: Alprazolam Lamictal Cymbalta Gabapentin BuSpar Trileptal-ineffective Latuda- helped for several days and then signs and symptoms significantly worsened Abilify- concentration difficulties Hydroxyzine Propanolol Lorazepam  PHQ2-9    Flowsheet Row Office Visit from 09/21/2020 in [facility] Visit from 05/20/2020 in [facility]  PHQ-2 Total Score 0 0        Review of Systems:  Review of Systems   Gastrointestinal:  Positive for diarrhea.  Musculoskeletal:  Negative for gait problem.  Neurological:        Neuropathy  Psychiatric/Behavioral:         Please refer to HPI   Medications: I have reviewed the patient's current medications.  Current Outpatient Medications  Medication Sig Dispense Refill   aspirin 81 MG EC tablet Take 81 mg by mouth daily. Swallow whole.     Multiple Vitamin (MULTIVITAMIN) tablet Take 1 tablet by mouth daily.     Multiple Vitamins-Minerals (HAIR SKIN AND NAILS FORMULA PO) Take by mouth.     Pancrelipase, Lip-Prot-Amyl, (ZENPEP) 40000-126000 units CPEP Take one capsule by mouth with meals and snacks. 240 capsule 11   ALPRAZolam (XANAX) 1 MG tablet TAKE 1 TABLET BY MOUTH AT BEDTIME AS NEEDED FOR ANXIETY. 30 tablet 5   busPIRone (BUSPAR) 15 MG tablet Take 1 tablet (15 mg total) by mouth daily. 90 tablet 1   DULoxetine (CYMBALTA) 60 MG capsule Take 1 capsule (60 mg total) by mouth daily. TAKE 1 CAPSULE BY MOUTH EVERY DAY 90 capsule 1   gabapentin (NEURONTIN) 300 MG capsule Take 1 capsule (300 mg total) by mouth 3 (three) times daily. 270 capsule 1   lamoTRIgine (LAMICTAL) 150 MG tablet Take 1 tablet (150 mg total) by mouth daily. 90 tablet 1   No current facility-administered medications for this visit.    Medication Side Effects: None  Allergies:  Allergies  Allergen Reactions   Penicillins     Childhood allergy    Past Medical History:  Diagnosis Date   Discoid lupus  Peripheral arterial disease ([place]) 05/25/2020   Skin cancer     Past Medical History, Surgical history, Social history, and Family history were reviewed and updated as appropriate.   Please see review of systems for further details on the patient's review from today.   Objective:   Physical Exam:  Wt 104 lb (47.2 kg)   BMI 17.58 kg/m   Physical Exam Constitutional:      General: She is not in acute distress. Musculoskeletal:        General: No deformity.   Neurological:     Mental Status: She is alert and oriented to person, place, and time.     Coordination: Coordination normal.  Psychiatric:        Attention and Perception: Attention and perception normal. She does not perceive auditory or visual hallucinations.        Mood and Affect: Mood normal. Mood is not anxious or depressed. Affect is not labile, blunt, angry or inappropriate.        Speech: Speech normal.        Behavior: Behavior normal.        Thought Content: Thought content normal. Thought content is not paranoid or delusional. Thought content does not include homicidal or suicidal ideation. Thought content does not include homicidal or suicidal plan.        Cognition and Memory: Cognition and memory normal.        Judgment: Judgment normal.     Comments: Insight intact    Lab Review:     Component Value Date/Time   NA 138 09/21/2020 0944   K 5.0 09/21/2020 0944   CL 100 09/21/2020 0944   CO2 21 09/21/2020 0944   GLUCOSE 81 09/21/2020 0944   BUN 13 09/21/2020 0944   CREATININE 0.62 09/21/2020 0944   CALCIUM 10.2 09/21/2020 0944   PROT 7.4 11/11/2020 1112   PROT 7.9 09/21/2020 0944   ALBUMIN 4.3 11/11/2020 1112   ALBUMIN 4.7 09/21/2020 0944   AST 25 11/11/2020 1112   ALT 22 11/11/2020 1112   ALKPHOS 102 11/11/2020 1112   BILITOT 0.4 11/11/2020 1112   BILITOT 0.2 09/21/2020 0944   GFRNONAA 98 05/20/2020 1133   GFRAA 113 05/20/2020 1133       Component Value Date/Time   WBC 10.8 09/21/2020 0944   RBC 4.44 09/21/2020 0944   HGB 14.9 09/21/2020 0944   HCT 44.2 09/21/2020 0944   PLT 401 09/21/2020 0944   MCV 100 (H) 09/21/2020 0944   MCH 33.6 (H) 09/21/2020 0944   MCHC 33.7 09/21/2020 0944   RDW 12.5 09/21/2020 0944   LYMPHSABS 2.9 09/21/2020 0944   EOSABS 0.3 09/21/2020 0944   BASOSABS 0.1 09/21/2020 0944    No results found for: POCLITH, LITHIUM   No results found for: PHENYTOIN, PHENOBARB, VALPROATE, CBMZ   .res Assessment: Plan:    Discussed  decreasing alcohol use, particularly with recent GI issues.  Patient reports that she has contemplated stopping alcohol use, however she is not ready to reduce alcohol intake at this time.  Advised patient to contact provider if she is interested in reducing or stopping alcohol intake in the future and can make referrals or offer treatment options based on her preferences. Continue BuSpar 15 mg daily for anxiety. Continue gabapentin 300 mg 3 times daily for anxiety. Continue Cymbalta 60 mg daily for mood and anxiety. Continue Xanax 1 mg at bedtime as needed for anxiety. Patient to follow-up in 6 months or sooner if clinically indicated.  Patient advised to contact office with any questions, adverse effects, or acute worsening in signs and symptoms.   Jacqueline Lang was seen today for follow-up.  Diagnoses and all orders for this visit:  Generalized anxiety disorder -     ALPRAZolam (XANAX) 1 MG tablet; TAKE 1 TABLET BY MOUTH AT BEDTIME AS NEEDED FOR ANXIETY. -     busPIRone (BUSPAR) 15 MG tablet; Take 1 tablet (15 mg total) by mouth daily. -     DULoxetine (CYMBALTA) 60 MG capsule; Take 1 capsule (60 mg total) by mouth daily. TAKE 1 CAPSULE BY MOUTH EVERY DAY -     gabapentin (NEURONTIN) 300 MG capsule; Take 1 capsule (300 mg total) by mouth 3 (three) times daily.  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 TAKE 1 CAPSULE BY MOUTH EVERY DAY -     lamoTRIgine (LAMICTAL) 150 MG tablet; Take 1 tablet (150 mg total) by mouth daily.    Please see After Visit Summary for patient specific instructions.  Future Appointments  Date Time Provider [place]  12/14/2020 10:30 AM Daryel November, MD [facility]  06/03/2021 10:00 AM Thayer Headings, PMHNP [facility] None    No orders of the defined types were placed in this encounter.   -------------------------------

## 2020-12-08 ENCOUNTER — Telehealth: Payer: Self-pay

## 2020-12-08 NOTE — Telephone Encounter (Signed)
Called patient to inform her that with the zenpep card the medication was the same price. Asked patient if she could come by to fill out patient assistance papers. Patient stated she could but that the samples on Zenpep she has been taking of Zenpep has not helped for the past seven days. Patient has been taking two tablets daily.

## 2020-12-09 NOTE — Telephone Encounter (Signed)
Left VM for patient to call me back.

## 2020-12-12 ENCOUNTER — Other Ambulatory Visit: Payer: Self-pay | Admitting: Adult Health

## 2020-12-12 DIAGNOSIS — F3176 Bipolar disorder, in full remission, most recent episode depressed: Secondary | ICD-10-CM

## 2020-12-14 ENCOUNTER — Encounter: Payer: 59 | Admitting: Gastroenterology

## 2020-12-15 ENCOUNTER — Other Ambulatory Visit: Payer: Self-pay

## 2020-12-15 DIAGNOSIS — F3176 Bipolar disorder, in full remission, most recent episode depressed: Secondary | ICD-10-CM

## 2020-12-15 DIAGNOSIS — F411 Generalized anxiety disorder: Secondary | ICD-10-CM

## 2020-12-15 MED ORDER — BUSPIRONE HCL 15 MG PO TABS: 15.0000 mg | ORAL_TABLET | Freq: Every day | ORAL | 1 refills | Status: DC

## 2020-12-15 MED ORDER — LAMOTRIGINE 150 MG PO TABS: 150.0000 mg | ORAL_TABLET | Freq: Every day | ORAL | 1 refills | Status: DC

## 2020-12-15 MED ORDER — DULOXETINE HCL 60 MG PO CPEP: 60.0000 mg | ORAL_CAPSULE | Freq: Every day | ORAL | 1 refills | Status: DC

## 2020-12-22 NOTE — Telephone Encounter (Signed)
Called patient and let her know that she has been approved for the Zenpep patient assistance program through 12/22/2021. Medication will be shipped to the office and patient will be notified once it arrives.

## 2020-12-24 NOTE — Telephone Encounter (Signed)
Called patient and informed her that Zenpep from the patient assistance program was up front ready for her to pick up.

## 2021-06-03 ENCOUNTER — Ambulatory Visit (INDEPENDENT_AMBULATORY_CARE_PROVIDER_SITE_OTHER): Payer: 59 | Admitting: Psychiatry

## 2021-06-03 ENCOUNTER — Other Ambulatory Visit: Payer: Self-pay

## 2021-06-03 ENCOUNTER — Encounter: Payer: Self-pay | Admitting: Psychiatry

## 2021-06-03 DIAGNOSIS — F3176 Bipolar disorder, in full remission, most recent episode depressed: Secondary | ICD-10-CM

## 2021-06-03 DIAGNOSIS — F411 Generalized anxiety disorder: Secondary | ICD-10-CM | POA: Diagnosis not present

## 2021-06-03 MED ORDER — BUSPIRONE HCL 15 MG PO TABS: 15.0000 mg | ORAL_TABLET | Freq: Every day | ORAL | 1 refills | Status: DC

## 2021-06-03 MED ORDER — DULOXETINE HCL 60 MG PO CPEP: 60.0000 mg | ORAL_CAPSULE | Freq: Every day | ORAL | 1 refills | Status: DC

## 2021-06-03 MED ORDER — ALPRAZOLAM 1 MG PO TABS: ORAL_TABLET | ORAL | 5 refills | Status: DC

## 2021-06-03 MED ORDER — LAMOTRIGINE 150 MG PO TABS: 150.0000 mg | ORAL_TABLET | Freq: Every day | ORAL | 1 refills | Status: DC

## 2021-06-03 MED ORDER — GABAPENTIN 300 MG PO CAPS: 300.0000 mg | ORAL_CAPSULE | Freq: Three times a day (TID) | ORAL | 1 refills | Status: DC

## 2021-06-03 NOTE — Progress Notes (Signed)
Jacqueline Lang [id number] December 13, 1960 61 y.o.  Subjective:   Patient ID:  Jacqueline Lang is a 61 y.o. (DOB 23-Dec-1960) female.  Chief Complaint:  Chief Complaint  Patient presents with   Follow-up    Anxiety, Depression, insomnia    HPI NALEA SALCE presents to the office today for follow-up of anxiety, mood disturbance, and insomnia. She reports that anxiety has been "ok, I have my moments... but not severe." Denies depressed mood. Sleeping well. She reports feeling tired "all the time." She reports weight loss due to issue with her pancreas. She reports that she eats a good lunch and drinks a Boost in the morning and a light supper. She reports motivation is good at work. Concentration has been adequate. Denies SI.   She reports that her alcohol use is "the same." She reports, "I've been thinking seriously about quitting." She reports that she is thinking about stopping alcohol after vacation in May. She reports that she quit before for 20 years.   Husband had to have his gallbladder removed Saturday. He was also diagnosed with diabetes.   Continues to work at [employer].   Past medication trials: Alprazolam Lamictal Cymbalta Gabapentin BuSpar Trileptal-ineffective Latuda- helped for several days and then signs and symptoms significantly worsened Abilify- concentration difficulties Hydroxyzine Propanolol Lorazepam    PHQ2-9    Flowsheet Row Office Visit from 09/21/2020 in [facility] Visit from 05/20/2020 in [facility]  PHQ-2 Total Score 0 0        Review of Systems:  Review of Systems  Gastrointestinal:        She reports diarrhea has decreased in frequency  Musculoskeletal:  Negative for gait problem.  Neurological:  Negative for tremors.  Psychiatric/Behavioral:         Please refer to HPI   Medications: I have reviewed the patient's current medications.  Current Outpatient Medications  Medication Sig Dispense Refill    aspirin 81 MG EC tablet Take 81 mg by mouth daily. Swallow whole.     Multiple Vitamin (MULTIVITAMIN) tablet Take 1 tablet by mouth daily.     Multiple Vitamins-Minerals (HAIR SKIN AND NAILS FORMULA PO) Take by mouth.     Pancrelipase, Lip-Prot-Amyl, (ZENPEP) 40000-126000 units CPEP Take one capsule by mouth with meals and snacks. 240 capsule 11   ALPRAZolam (XANAX) 1 MG tablet TAKE 1 TABLET BY MOUTH AT BEDTIME AS NEEDED FOR ANXIETY. 30 tablet 5   busPIRone (BUSPAR) 15 MG tablet Take 1 tablet (15 mg total) by mouth daily. 90 tablet 1   DULoxetine (CYMBALTA) 60 MG capsule Take 1 capsule (60 mg total) by mouth daily. TAKE 1 CAPSULE BY MOUTH EVERY DAY 90 capsule 1   gabapentin (NEURONTIN) 300 MG capsule Take 1 capsule (300 mg total) by mouth 3 (three) times daily. 270 capsule 1   lamoTRIgine (LAMICTAL) 150 MG tablet Take 1 tablet (150 mg total) by mouth daily. 90 tablet 1   No current facility-administered medications for this visit.    Medication Side Effects: None  Allergies:  Allergies  Allergen Reactions   Penicillins     Childhood allergy    Past Medical History:  Diagnosis Date   Discoid lupus    Peripheral arterial disease ([place]) 05/25/2020   Skin cancer     Past Medical History, Surgical history, Social history, and Family history were reviewed and updated as appropriate.   Please see review of systems for further details on the patient's review from today.  Objective:   Physical Exam:  BP (!) 149/76    Pulse 70    Wt 99 lb (44.9 kg)    BMI 16.73 kg/m   Physical Exam Constitutional:      General: She is not in acute distress. Musculoskeletal:        General: No deformity.  Neurological:     Mental Status: She is alert and oriented to person, place, and time.     Coordination: Coordination normal.  Psychiatric:        Attention and Perception: Attention and perception normal. She does not perceive auditory or visual hallucinations.        Mood and Affect: Mood  normal. Mood is not anxious or depressed. Affect is not labile, blunt, angry or inappropriate.        Speech: Speech normal.        Behavior: Behavior normal.        Thought Content: Thought content normal. Thought content is not paranoid or delusional. Thought content does not include homicidal or suicidal ideation. Thought content does not include homicidal or suicidal plan.        Cognition and Memory: Cognition and memory normal.        Judgment: Judgment normal.     Comments: Insight intact    Lab Review:     Component Value Date/Time   NA 138 09/21/2020 0944   K 5.0 09/21/2020 0944   CL 100 09/21/2020 0944   CO2 21 09/21/2020 0944   GLUCOSE 81 09/21/2020 0944   BUN 13 09/21/2020 0944   CREATININE 0.62 09/21/2020 0944   CALCIUM 10.2 09/21/2020 0944   PROT 7.4 11/11/2020 1112   PROT 7.9 09/21/2020 0944   ALBUMIN 4.3 11/11/2020 1112   ALBUMIN 4.7 09/21/2020 0944   AST 25 11/11/2020 1112   ALT 22 11/11/2020 1112   ALKPHOS 102 11/11/2020 1112   BILITOT 0.4 11/11/2020 1112   BILITOT 0.2 09/21/2020 0944   GFRNONAA 98 05/20/2020 1133   GFRAA 113 05/20/2020 1133       Component Value Date/Time   WBC 10.8 09/21/2020 0944   RBC 4.44 09/21/2020 0944   HGB 14.9 09/21/2020 0944   HCT 44.2 09/21/2020 0944   PLT 401 09/21/2020 0944   MCV 100 (H) 09/21/2020 0944   MCH 33.6 (H) 09/21/2020 0944   MCHC 33.7 09/21/2020 0944   RDW 12.5 09/21/2020 0944   LYMPHSABS 2.9 09/21/2020 0944   EOSABS 0.3 09/21/2020 0944   BASOSABS 0.1 09/21/2020 0944    No results found for: POCLITH, LITHIUM   No results found for: PHENYTOIN, PHENOBARB, VALPROATE, CBMZ   .res Assessment: Plan:    Encouraged pt to consider reducing or stopping alcohol use since she reports that she has had some issues with her pancreas. Discussed that stopping alcohol would likely be helpful for her GI s/s and overall health. She reports that she has been thinking more seriously about stopping alcohol use and thinks  that she may stop after a vacation in May. She declines any assistance with stopping alcohol at this time and reports that she would prefer to stop alcohol "cold [place]" like she has in the past. Advised pt to seek treatment if she experiences severe withdrawal s/s. Also, advised her to call this office if she decided that she would like help in the future for alcohol withdrawal and she verbalizes understanding.  Continue Cymbalta 60 mg po qd for anxiety and depression.  Continue Gabapentin 300 mg po BID for anxiety.  Continue Lamictal 150 mg po qd for mood s/s.  Continue Buspar 15 mg po BID for anxiety.  Continue Xanax 1 mg 1/2 tab po BID. Discussed not combining Xanax with ETOH.  Pt to follow-up in 6 months or sooner if clinically indicated.  Patient advised to contact office with any questions, adverse effects, or acute worsening in signs and symptoms.    Jadda was seen today for follow-up.  Diagnoses and all orders for this visit:  Generalized anxiety disorder -     ALPRAZolam (XANAX) 1 MG tablet; TAKE 1 TABLET BY MOUTH AT BEDTIME AS NEEDED FOR ANXIETY. -     busPIRone (BUSPAR) 15 MG tablet; Take 1 tablet (15 mg total) by mouth daily. -     DULoxetine (CYMBALTA) 60 MG capsule; Take 1 capsule (60 mg total) by mouth daily. TAKE 1 CAPSULE BY MOUTH EVERY DAY -     gabapentin (NEURONTIN) 300 MG capsule; Take 1 capsule (300 mg total) by mouth 3 (three) times daily.  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 TAKE 1 CAPSULE BY MOUTH EVERY DAY -     lamoTRIgine (LAMICTAL) 150 MG tablet; Take 1 tablet (150 mg total) by mouth daily.     Please see After Visit Summary for patient specific instructions.  Future Appointments  Date Time Provider [place]  12/02/2021 10:00 AM Thayer Headings, PMHNP CP-CP None    No orders of the defined types were placed in this  encounter.   -------------------------------

## 2021-12-02 ENCOUNTER — Ambulatory Visit: Payer: 59 | Admitting: Psychiatry

## 2021-12-02 ENCOUNTER — Encounter: Payer: Self-pay | Admitting: Psychiatry

## 2021-12-02 DIAGNOSIS — F411 Generalized anxiety disorder: Secondary | ICD-10-CM | POA: Diagnosis not present

## 2021-12-02 DIAGNOSIS — F3176 Bipolar disorder, in full remission, most recent episode depressed: Secondary | ICD-10-CM

## 2021-12-02 MED ORDER — DULOXETINE HCL 60 MG PO CPEP: 60.0000 mg | ORAL_CAPSULE | Freq: Every day | ORAL | 1 refills | Status: DC

## 2021-12-02 MED ORDER — BUSPIRONE HCL 15 MG PO TABS: 15.0000 mg | ORAL_TABLET | Freq: Every day | ORAL | 1 refills | Status: DC

## 2021-12-02 MED ORDER — LAMOTRIGINE 150 MG PO TABS: 150.0000 mg | ORAL_TABLET | Freq: Every day | ORAL | 1 refills | Status: DC

## 2021-12-02 MED ORDER — GABAPENTIN 300 MG PO CAPS: 300.0000 mg | ORAL_CAPSULE | Freq: Three times a day (TID) | ORAL | 1 refills | Status: DC

## 2021-12-02 MED ORDER — ALPRAZOLAM 1 MG PO TABS: ORAL_TABLET | ORAL | 5 refills | Status: DC

## 2021-12-02 NOTE — Progress Notes (Signed)
Jacqueline Lang [id number] Jun 09, 1960 61 y.o.  Subjective:   Patient ID:  Jacqueline Lang is a 62 y.o. (DOB 03-30-1961) female.  Chief Complaint:  Chief Complaint  Patient presents with   Anxiety   Follow-up    Mood disturbance, insomnia    HPI Jacqueline Lang presents to the office today for follow-up of anxiety, depression, and insomnia.   She reports that she has had increased stress. Her oldest grandson has been having difficulty with addiction. She reports that he has had severe anger, controlling behaviors, and paranoia. Jacqueline Lang is married and has 2 children. She reports that she is called in to mediate by other family members. This has also caused her daughter severe stress. She reports that she is worried about her daughter and grandson.   Her ETOH use increased some after grandson's use and she as decreased her ETOH since then back to her baseline. "It's still too much... I have been seriously thinking about quitting."   Denies panic attacks. She reports that her heart will race at times with anxiety. She reports frequent worry. Denies any recent depression. Denies manic s/s or excessive spending. Sleeping ok with medications. She reports that she is eating regularly. Energy and motivation is good when she is at work and then she comes home her energy and motivation is low. She reports that her energy and motivation are better towards house work on her days off. Concentration has been ok. Denies SI.   She is still working at [employer] 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   Xanax last filled 11/04/21.   Past medication trials: Alprazolam Lamictal Cymbalta Gabapentin BuSpar Trileptal-ineffective Latuda- helped for several days and then signs and symptoms significantly worsened Abilify- concentration difficulties Hydroxyzine Propanolol Lorazepam  PHQ2-9    Flowsheet Row Office Visit from 09/21/2020 in [facility] Visit from 05/20/2020 in  [facility]  PHQ-2 Total Score 0 0        Review of Systems:  Review of Systems  Gastrointestinal:  Negative for diarrhea.  Musculoskeletal:  Negative for gait problem.  Neurological:        Neuropathy  Psychiatric/Behavioral:         Please refer to HPI    Medications: I have reviewed the patient's current medications.  Current Outpatient Medications  Medication Sig Dispense Refill   aspirin 81 MG EC tablet Take 81 mg by mouth as needed. Swallow whole.     ibuprofen (ADVIL) 400 MG tablet Take 400 mg by mouth every 6 (six) hours as needed.     Loperamide HCl (IMODIUM PO) Take by mouth.     Multiple Vitamins-Minerals (HAIR SKIN AND NAILS FORMULA PO) Take by mouth.     ALPRAZolam (XANAX) 1 MG tablet TAKE 1 TABLET BY MOUTH AT BEDTIME AS NEEDED FOR ANXIETY. 30 tablet 5   busPIRone (BUSPAR) 15 MG tablet Take 1 tablet (15 mg total) by mouth 2 (two) times daily. 180 tablet 1   busPIRone (BUSPAR) 15 MG tablet Take 1 tablet (15 mg total) by mouth 2 (two) times daily. 180 tablet 1   busPIRone (BUSPAR) 15 MG tablet Take 1 tablet (15 mg total) by mouth daily. 90 tablet 1   DULoxetine (CYMBALTA) 60 MG capsule Take 1 capsule (60 mg total) by mouth daily. TAKE 1 CAPSULE BY MOUTH EVERY DAY 90 capsule 1   gabapentin (NEURONTIN) 300 MG capsule Take 1 capsule (300 mg total) by mouth 3 (three) times daily. 270 capsule 1  lamoTRIgine (LAMICTAL) 150 MG tablet Take 1 tablet (150 mg total) by mouth daily. 90 tablet 1   Multiple Vitamin (MULTIVITAMIN) tablet Take 1 tablet by mouth daily.     Pancrelipase, Lip-Prot-Amyl, (ZENPEP) 40000-126000 units CPEP Take one capsule by mouth with meals and snacks. (Patient not taking: Reported on 12/02/2021) 240 capsule 11   No current facility-administered medications for this visit.    Medication Side Effects: None  Allergies:  Allergies  Allergen Reactions   Penicillins     Childhood allergy    Past Medical History:  Diagnosis Date   Discoid  lupus    Peripheral arterial disease ([place]) 05/25/2020   Skin cancer     Past Medical History, Surgical history, Social history, and Family history were reviewed and updated as appropriate.   Please see review of systems for further details on the patient's review from today.   Objective:   Physical Exam:  Wt 100 lb (45.4 kg)   BMI 16.90 kg/m   Physical Exam Constitutional:      General: She is not in acute distress. Musculoskeletal:        General: No deformity.  Neurological:     Mental Status: She is alert and oriented to person, place, and time.     Coordination: Coordination normal.  Psychiatric:        Attention and Perception: Attention and perception normal. She does not perceive auditory or visual hallucinations.        Mood and Affect: Mood is anxious. Mood is not depressed. Affect is not labile, blunt, angry or inappropriate.        Speech: Speech normal.        Behavior: Behavior normal.        Thought Content: Thought content normal. Thought content is not paranoid or delusional. Thought content does not include homicidal or suicidal ideation. Thought content does not include homicidal or suicidal plan.        Cognition and Memory: Cognition and memory normal.        Judgment: Judgment normal.     Comments: Insight intact     Lab Review:     Component Value Date/Time   NA 138 09/21/2020 0944   K 5.0 09/21/2020 0944   CL 100 09/21/2020 0944   CO2 21 09/21/2020 0944   GLUCOSE 81 09/21/2020 0944   BUN 13 09/21/2020 0944   CREATININE 0.62 09/21/2020 0944   CALCIUM 10.2 09/21/2020 0944   PROT 7.4 11/11/2020 1112   PROT 7.9 09/21/2020 0944   ALBUMIN 4.3 11/11/2020 1112   ALBUMIN 4.7 09/21/2020 0944   AST 25 11/11/2020 1112   ALT 22 11/11/2020 1112   ALKPHOS 102 11/11/2020 1112   BILITOT 0.4 11/11/2020 1112   BILITOT 0.2 09/21/2020 0944   GFRNONAA 98 05/20/2020 1133   GFRAA 113 05/20/2020 1133       Component Value Date/Time   WBC 10.8 09/21/2020 0944    RBC 4.44 09/21/2020 0944   HGB 14.9 09/21/2020 0944   HCT 44.2 09/21/2020 0944   PLT 401 09/21/2020 0944   MCV 100 (H) 09/21/2020 0944   MCH 33.6 (H) 09/21/2020 0944   MCHC 33.7 09/21/2020 0944   RDW 12.5 09/21/2020 0944   LYMPHSABS 2.9 09/21/2020 0944   EOSABS 0.3 09/21/2020 0944   BASOSABS 0.1 09/21/2020 0944    No results found for: "POCLITH", "LITHIUM"   No results found for: "PHENYTOIN", "PHENOBARB", "VALPROATE", "CBMZ"   .res Assessment: Plan:    Will continue current  plan of care since target signs and symptoms are well controlled without any tolerability issues. She reports that she is contemplating further reducing ETOH intake.  Continue Lamictal 150 mg po qd for mood stabilization.  Continue Buspar 15 mg po BID for anxiety.  Continue Cymbalta 60 mg po qd for depression and anxiety.  Continue Gabapentin 300 mg TID for anxiety.  Continue Xanax 1 mg po QHS prn anxiety.  Pt to follow-up in 6 months or sooner if clinically indicated.  Patient advised to contact office with any questions, adverse effects, or acute worsening in signs and symptoms.'  Damien was seen today for anxiety and follow-up.  Diagnoses and all orders for this visit:  Depressed bipolar I disorder in remission ([place]) -     lamoTRIgine (LAMICTAL) 150 MG tablet; Take 1 tablet (150 mg total) by mouth daily. -     DULoxetine (CYMBALTA) 60 MG capsule; Take 1 capsule (60 mg total) by mouth daily. TAKE 1 CAPSULE BY MOUTH EVERY DAY  Generalized anxiety disorder -     gabapentin (NEURONTIN) 300 MG capsule; Take 1 capsule (300 mg total) by mouth 3 (three) times daily. -     ALPRAZolam (XANAX) 1 MG tablet; TAKE 1 TABLET BY MOUTH AT BEDTIME AS NEEDED FOR ANXIETY. -     busPIRone (BUSPAR) 15 MG tablet; Take 1 tablet (15 mg total) by mouth daily. -     DULoxetine (CYMBALTA) 60 MG capsule; Take 1 capsule (60 mg total) by mouth daily. TAKE 1 CAPSULE BY MOUTH EVERY DAY     Please see After Visit Summary for  patient specific instructions.  Future Appointments  Date Time Provider [place]  05/31/2022 10:00 AM Thayer Headings, PMHNP CP-CP None    No orders of the defined types were placed in this encounter.   -------------------------------

## 2022-01-12 ENCOUNTER — Encounter: Payer: Self-pay | Admitting: Internal Medicine

## 2022-01-16 IMAGING — US US ABDOMEN LIMITED
1 series · 14 of 25 positions shown · non-contrast
Comparison: None.

CLINICAL DATA: Weight loss.  Alcohol use disorder.

EXAM:
ULTRASOUND ABDOMEN LIMITED RIGHT UPPER QUADRANT

[Series 1: us abdomen limited · 14 of 40 slices shown]
[im 1/40]
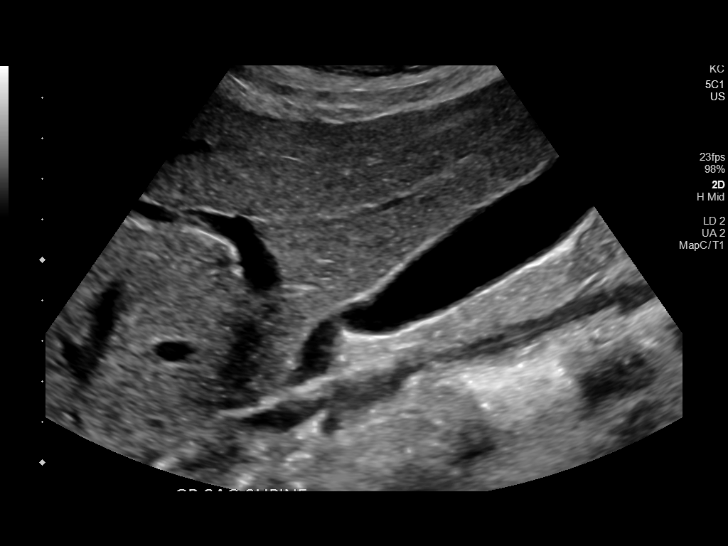
[im 4/40]
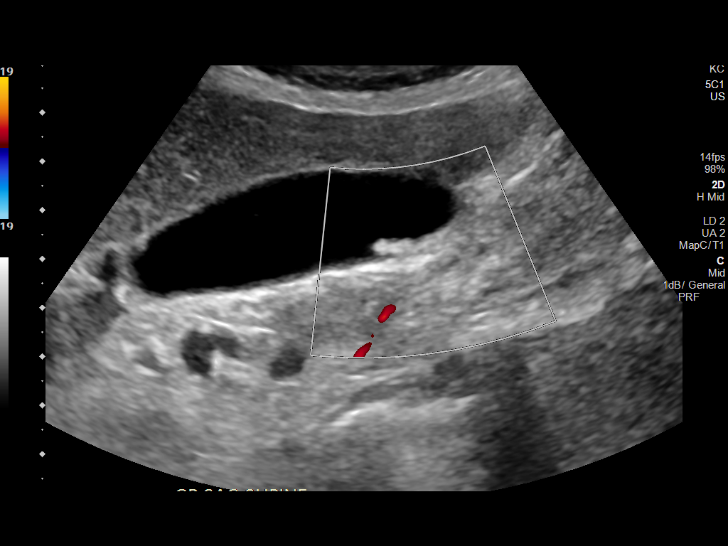
[im 7/40]
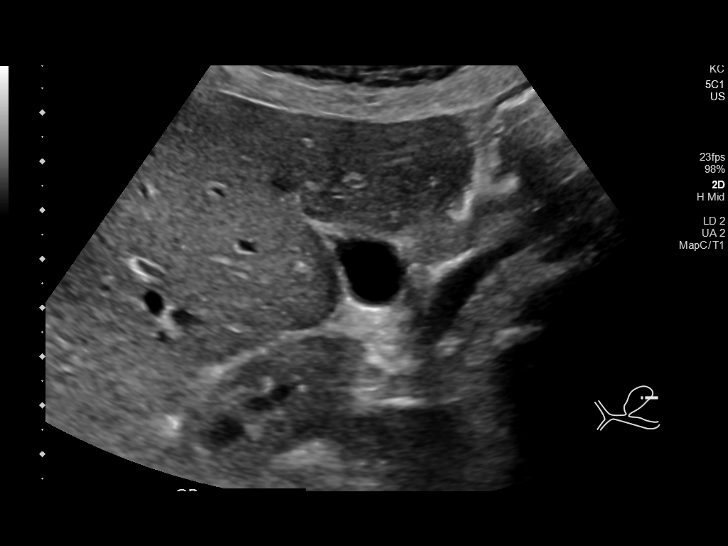
[im 10/40]
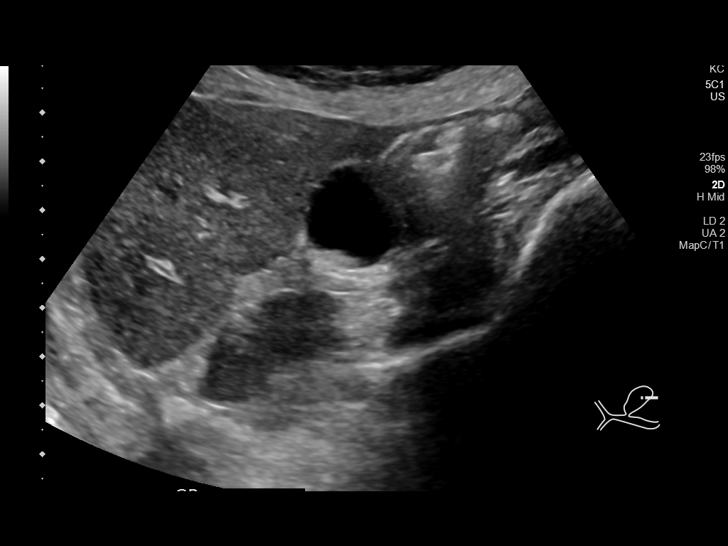
[im 14/40]
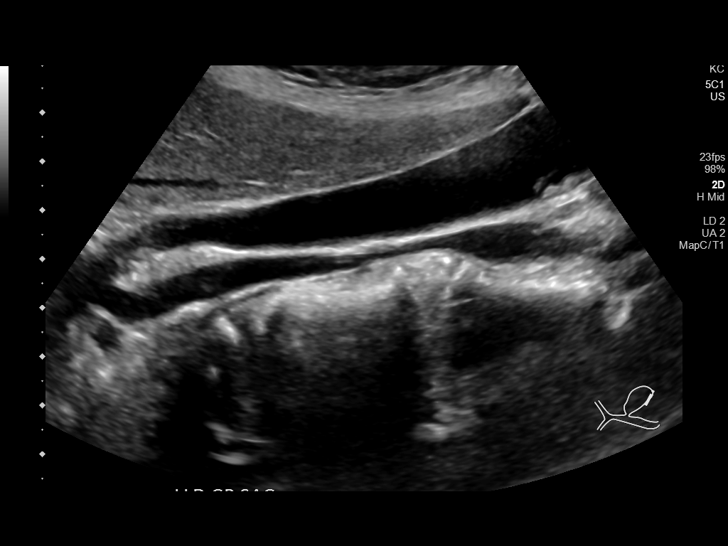
[im 15/40]
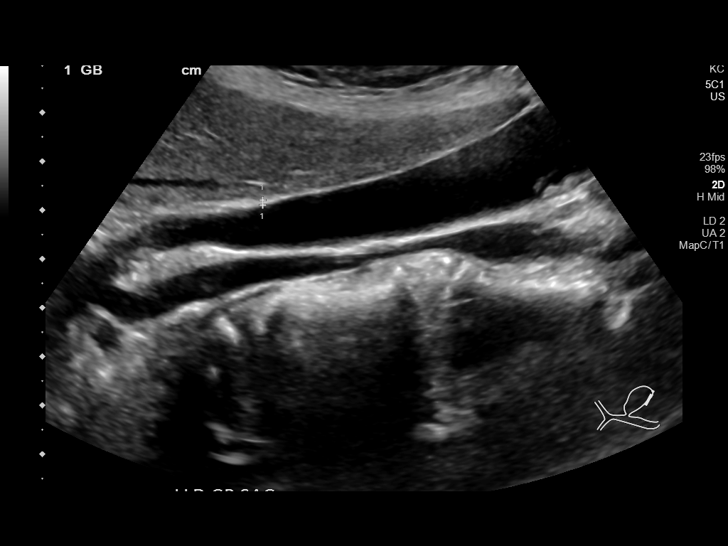
[im 18/40]
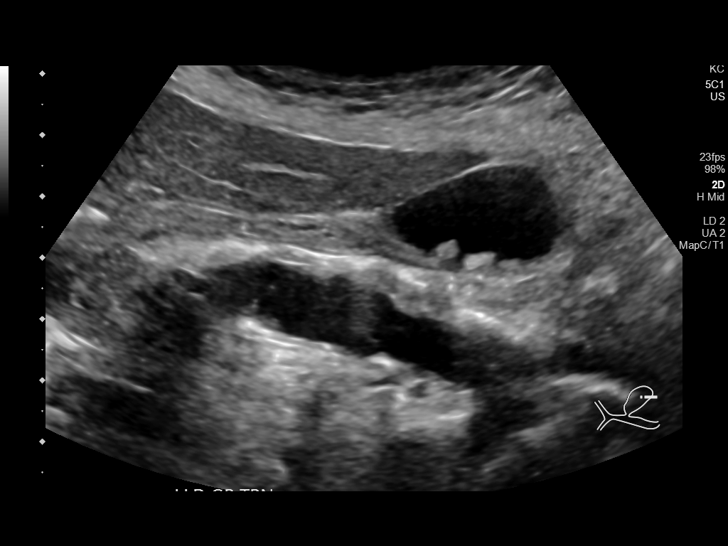
[im 22/40]
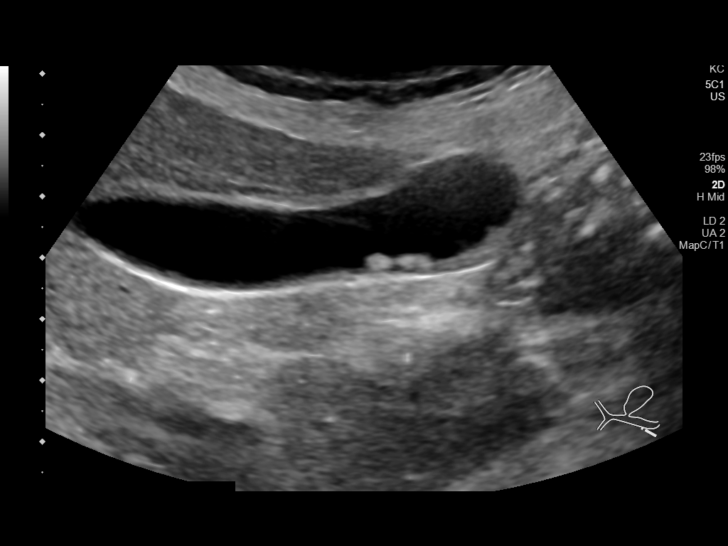
[im 25/40]
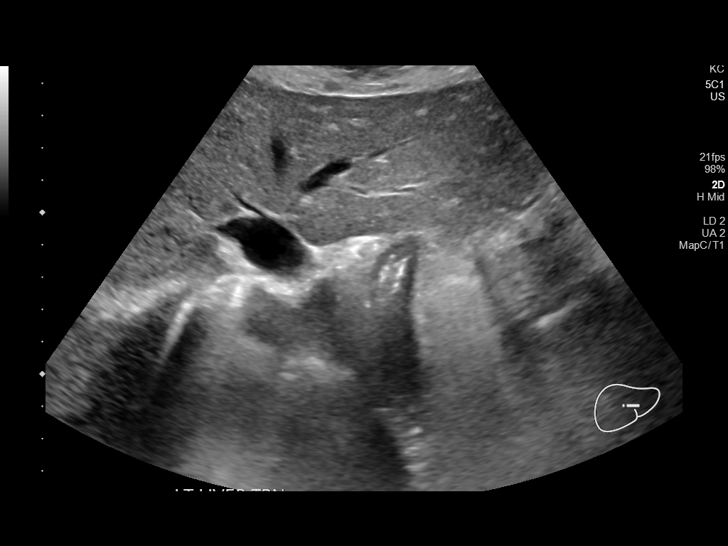
[im 27/40]
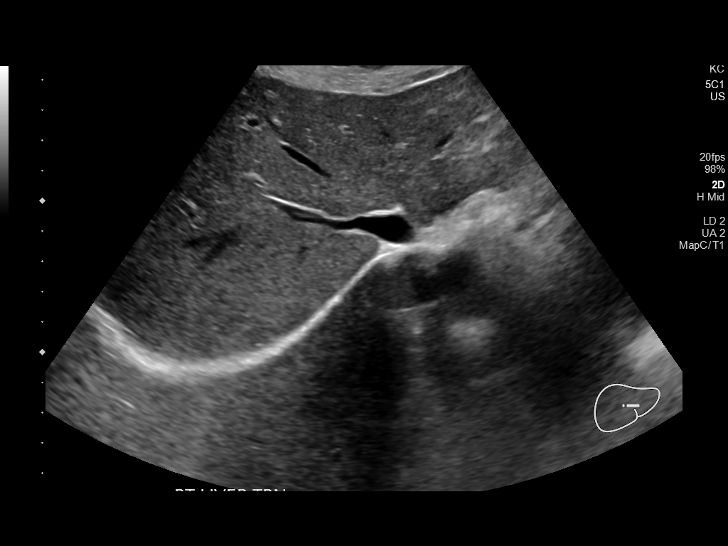
[im 30/40]
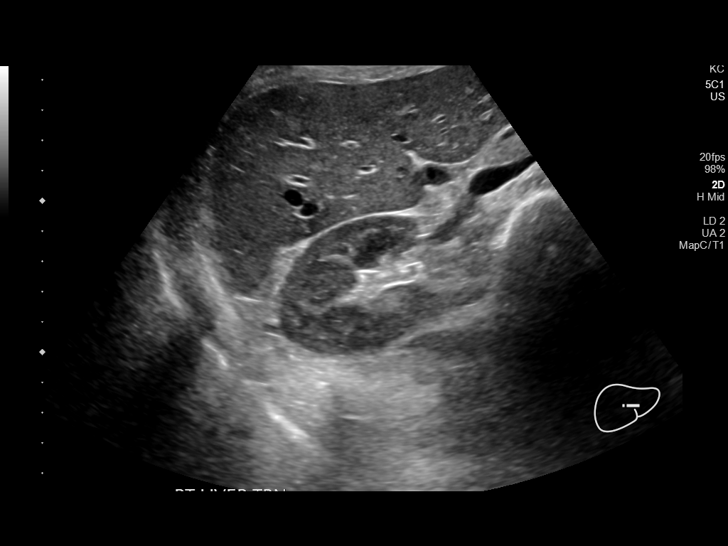
[im 33/40]
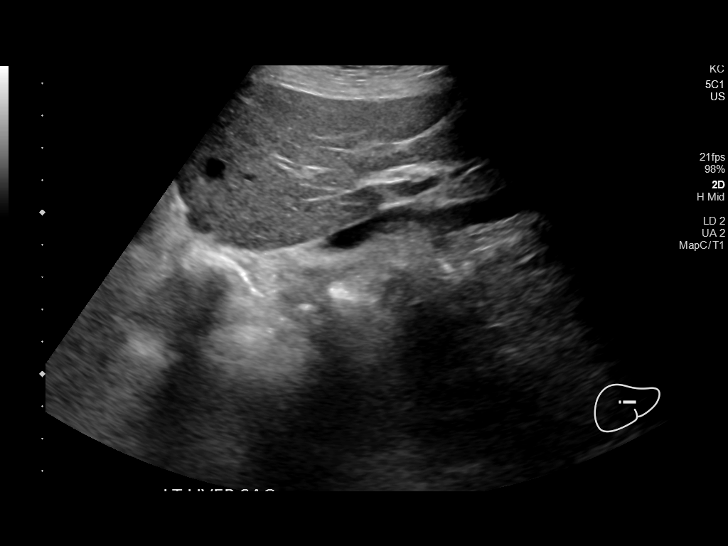
[im 36/40]
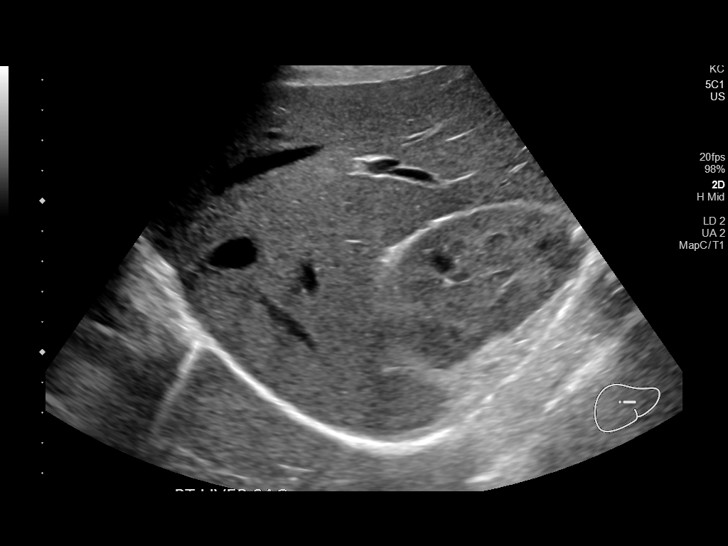
[im 40/40]
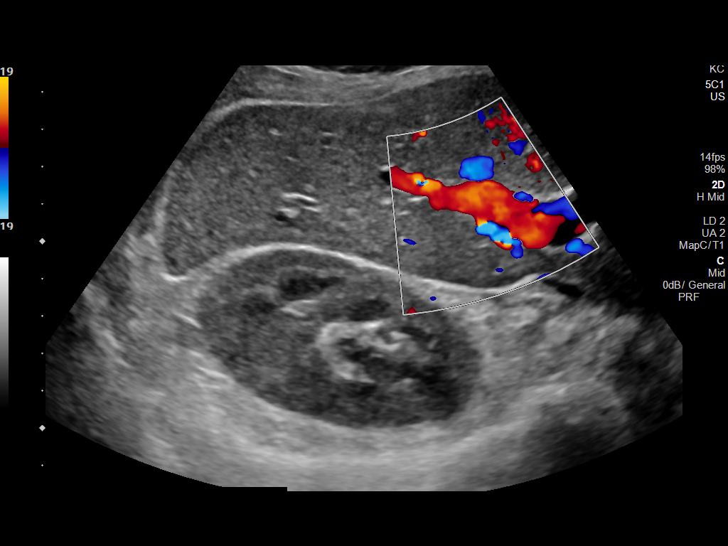

[14 of 25 positions shown; findings below may reference images not displayed]

FINDINGS: Gallbladder:

There are small dependent layering echogenic foci without obvious
shadowing. This could be tumefactive sludge or nonshadowing
gallstones. No gallbladder wall thickening, pericholecystic fluid or
sonographic Murphy sign to suggest acute cholecystitis.

Common bile duct:

Diameter: 3.0 mm

Liver:

Normal echogenicity without focal lesion or biliary dilatation. I do
not see any definite sonographic findings to suggest cirrhosis.
Portal vein is patent on color Doppler imaging with normal direction
of blood flow towards the liver.

Other: None.
IMPRESSION: 1. Echogenic sludge versus is small nonshadowing gallstones.
2. No findings for acute cholecystitis.
3. Unremarkable sonographic appearance of the liver. No definite
findings for cirrhosis and no hepatic lesions.

## 2022-02-15 ENCOUNTER — Encounter: Payer: Self-pay | Admitting: Internal Medicine

## 2022-02-28 ENCOUNTER — Encounter: Payer: Self-pay | Admitting: Internal Medicine

## 2022-05-31 ENCOUNTER — Encounter: Payer: Self-pay | Admitting: Psychiatry

## 2022-05-31 ENCOUNTER — Ambulatory Visit (INDEPENDENT_AMBULATORY_CARE_PROVIDER_SITE_OTHER): Payer: Self-pay | Admitting: Psychiatry

## 2022-05-31 DIAGNOSIS — F411 Generalized anxiety disorder: Secondary | ICD-10-CM

## 2022-05-31 DIAGNOSIS — F3176 Bipolar disorder, in full remission, most recent episode depressed: Secondary | ICD-10-CM

## 2022-05-31 MED ORDER — BUSPIRONE HCL 15 MG PO TABS: 15.0000 mg | ORAL_TABLET | Freq: Two times a day (BID) | ORAL | 1 refills | Status: DC

## 2022-05-31 MED ORDER — LAMOTRIGINE 150 MG PO TABS: 150.0000 mg | ORAL_TABLET | Freq: Every day | ORAL | 1 refills | Status: DC

## 2022-05-31 MED ORDER — ALPRAZOLAM 1 MG PO TABS: ORAL_TABLET | ORAL | 5 refills | Status: DC

## 2022-05-31 MED ORDER — GABAPENTIN 300 MG PO CAPS: 300.0000 mg | ORAL_CAPSULE | Freq: Three times a day (TID) | ORAL | 1 refills | Status: DC

## 2022-05-31 NOTE — Progress Notes (Signed)
Jacqueline Lang [id number] 18-Dec-1960 62 y.o.  Subjective:   Patient ID:  Jacqueline Lang is a 62 y.o. (DOB 08/07/1960) female.  Chief Complaint:  Chief Complaint  Patient presents with   [place] presents to the office today for follow-up of depression and anxiety. She reports family stressors with grandson having substance abuse issues. Her great-grandchildren have been placed in foster care. She has been financially helping him. She reports that she has had severe anxiety in response to this situation. Denies panic attacks. She reports worry and rumination. She has been sleeping 12-14 hours some days- "just mentally and emotionally exhausting." Energy and motivation have been lower. She reports depressed mood. Appetite has been ok and is eating twice daily. Has been having work-related dreams. Concentration is adequate. Denies SI.   Continues to work part-time, 6 days a week and would like to have 5 days a week off.   She reports that she has had increased ETOH use at times. Does not drink when sleeping 12-14 hours.   Xanax last filled 05/01/22 x 6.  Past medication trials: Alprazolam Lamictal Cymbalta Gabapentin BuSpar Trileptal-ineffective Latuda- helped for several days and then signs and symptoms significantly worsened Abilify- concentration difficulties Hydroxyzine Propanolol Lorazepam   PHQ2-9    Flowsheet Row Office Visit from 09/21/2020 in [facility] Visit from 05/20/2020 in [facility]  PHQ-2 Total Score 0 0        Review of Systems:  Review of Systems  Gastrointestinal: Negative.   Musculoskeletal:  Negative for gait problem.       Left elbow pain  Neurological:  Negative for tremors and headaches.  Psychiatric/Behavioral:         Please refer to HPI    Medications: I have reviewed the patient's current medications.  Current Outpatient Medications  Medication Sig Dispense Refill    DULoxetine (CYMBALTA) 60 MG capsule Take 1 capsule (60 mg total) by mouth daily. TAKE 1 CAPSULE BY MOUTH EVERY DAY 90 capsule 1   ibuprofen (ADVIL) 400 MG tablet Take 400 mg by mouth every 6 (six) hours as needed.     Multiple Vitamins-Minerals (HAIR SKIN AND NAILS FORMULA PO) Take by mouth.     ALPRAZolam (XANAX) 1 MG tablet TAKE 1 TABLET BY MOUTH AT BEDTIME AS NEEDED FOR ANXIETY. 30 tablet 5   busPIRone (BUSPAR) 15 MG tablet Take 1 tablet (15 mg total) by mouth 2 (two) times daily. 180 tablet 1   busPIRone (BUSPAR) 15 MG tablet Take 1 tablet (15 mg total) by mouth daily. 90 tablet 1   busPIRone (BUSPAR) 15 MG tablet Take 1 tablet (15 mg total) by mouth 2 (two) times daily. 180 tablet 1   gabapentin (NEURONTIN) 300 MG capsule Take 1 capsule (300 mg total) by mouth 3 (three) times daily. 270 capsule 1   lamoTRIgine (LAMICTAL) 150 MG tablet Take 1 tablet (150 mg total) by mouth daily. 90 tablet 1   Loperamide HCl (IMODIUM PO) Take by mouth.     Multiple Vitamin (MULTIVITAMIN) tablet Take 1 tablet by mouth daily. (Patient not taking: Reported on 05/31/2022)     Pancrelipase, Lip-Prot-Amyl, (ZENPEP) 40000-126000 units CPEP Take one capsule by mouth with meals and snacks. (Patient not taking: Reported on 12/02/2021) 240 capsule 11   No current facility-administered medications for this visit.    Medication Side Effects: None  Allergies:  Allergies  Allergen Reactions   Penicillins  Childhood allergy    Past Medical History:  Diagnosis Date   Discoid lupus    Peripheral arterial disease ([place]) 05/25/2020   Skin cancer     Past Medical History, Surgical history, Social history, and Family history were reviewed and updated as appropriate.   Please see review of systems for further details on the patient's review from today.   Objective:   Physical Exam:  Wt 94 lb (42.6 kg)   BMI 15.89 kg/m   Physical Exam Constitutional:      General: She is not in acute  distress. Musculoskeletal:        General: No deformity.  Neurological:     Mental Status: She is alert and oriented to person, place, and time.     Coordination: Coordination normal.  Psychiatric:        Attention and Perception: Attention and perception normal. She does not perceive auditory or visual hallucinations.        Mood and Affect: Mood is anxious and depressed. Affect is not labile, blunt, angry or inappropriate.        Speech: Speech normal.        Behavior: Behavior normal.        Thought Content: Thought content normal. Thought content is not paranoid or delusional. Thought content does not include homicidal or suicidal ideation. Thought content does not include homicidal or suicidal plan.        Cognition and Memory: Cognition and memory normal.        Judgment: Judgment normal.     Comments: Insight intact     Lab Review:     Component Value Date/Time   NA 138 09/21/2020 0944   K 5.0 09/21/2020 0944   CL 100 09/21/2020 0944   CO2 21 09/21/2020 0944   GLUCOSE 81 09/21/2020 0944   BUN 13 09/21/2020 0944   CREATININE 0.62 09/21/2020 0944   CALCIUM 10.2 09/21/2020 0944   PROT 7.4 11/11/2020 1112   PROT 7.9 09/21/2020 0944   ALBUMIN 4.3 11/11/2020 1112   ALBUMIN 4.7 09/21/2020 0944   AST 25 11/11/2020 1112   ALT 22 11/11/2020 1112   ALKPHOS 102 11/11/2020 1112   BILITOT 0.4 11/11/2020 1112   BILITOT 0.2 09/21/2020 0944   GFRNONAA 98 05/20/2020 1133   GFRAA 113 05/20/2020 1133       Component Value Date/Time   WBC 10.8 09/21/2020 0944   RBC 4.44 09/21/2020 0944   HGB 14.9 09/21/2020 0944   HCT 44.2 09/21/2020 0944   PLT 401 09/21/2020 0944   MCV 100 (H) 09/21/2020 0944   MCH 33.6 (H) 09/21/2020 0944   MCHC 33.7 09/21/2020 0944   RDW 12.5 09/21/2020 0944   LYMPHSABS 2.9 09/21/2020 0944   EOSABS 0.3 09/21/2020 0944   BASOSABS 0.1 09/21/2020 0944    No results found for: "POCLITH", "LITHIUM"   No results found for: "PHENYTOIN", "PHENOBARB",  "VALPROATE", "CBMZ"   .res Assessment: Plan:    Pt reports that she would like to continue current medications without changes at this time since acute anxiety is in response to stressors with grandson. Discussed continuing to set appropriate boundaries and self-care.  Will continue Lamictal 150 mg po qd for mood stabilization.  Continue Buspar 15 mg po BID for anxiety.  Continue Gabapentin 800 mg 2-3 times daily for anxiety.  Continue Alprazolam 1 mg po QHS prn insomnia.  Pt to follow-up in 6 months or sooner if clinically indicated.  Patient advised to contact office with any  questions, adverse effects, or acute worsening in signs and symptoms.   Jacqueline Lang was seen today for anxiety.  Diagnoses and all orders for this visit:  Generalized anxiety disorder -     ALPRAZolam (XANAX) 1 MG tablet; TAKE 1 TABLET BY MOUTH AT BEDTIME AS NEEDED FOR ANXIETY. -     busPIRone (BUSPAR) 15 MG tablet; Take 1 tablet (15 mg total) by mouth 2 (two) times daily. -     gabapentin (NEURONTIN) 300 MG capsule; Take 1 capsule (300 mg total) by mouth 3 (three) times daily.  Depressed bipolar I disorder in remission ([facility]) -     lamoTRIgine (LAMICTAL) 150 MG tablet; Take 1 tablet (150 mg total) by mouth daily.     Please see After Visit Summary for patient specific instructions.  Future Appointments  Date Time Provider [place]  11/29/2022 10:00 AM Thayer Headings, PMHNP CP-CP None     No orders of the defined types were placed in this encounter.   -------------------------------

## 2022-07-14 ENCOUNTER — Telehealth: Payer: Self-pay | Admitting: Psychiatry

## 2022-07-14 DIAGNOSIS — F411 Generalized anxiety disorder: Secondary | ICD-10-CM

## 2022-07-14 DIAGNOSIS — F3176 Bipolar disorder, in full remission, most recent episode depressed: Secondary | ICD-10-CM

## 2022-07-14 MED ORDER — DULOXETINE HCL 60 MG PO CPEP: 60.0000 mg | ORAL_CAPSULE | Freq: Every day | ORAL | 1 refills | Status: DC

## 2022-07-14 MED ORDER — BUSPIRONE HCL 15 MG PO TABS: 15.0000 mg | ORAL_TABLET | Freq: Two times a day (BID) | ORAL | 1 refills | Status: DC

## 2022-07-14 MED ORDER — LAMOTRIGINE 150 MG PO TABS: 150.0000 mg | ORAL_TABLET | Freq: Every day | ORAL | 1 refills | Status: DC

## 2022-07-14 NOTE — Telephone Encounter (Signed)
Sent!

## 2022-07-14 NOTE — Telephone Encounter (Signed)
Pt LVM @ 9:33a.  She needs refills of Buspar, Lamotrigine and Duloxetine sent to [facility].  Next appt 7/23

## 2022-11-29 ENCOUNTER — Encounter: Payer: Self-pay | Admitting: Psychiatry

## 2022-11-29 ENCOUNTER — Ambulatory Visit (INDEPENDENT_AMBULATORY_CARE_PROVIDER_SITE_OTHER): Payer: Self-pay | Admitting: Psychiatry

## 2022-11-29 DIAGNOSIS — F411 Generalized anxiety disorder: Secondary | ICD-10-CM

## 2022-11-29 DIAGNOSIS — F3176 Bipolar disorder, in full remission, most recent episode depressed: Secondary | ICD-10-CM

## 2022-11-29 MED ORDER — BUSPIRONE HCL 15 MG PO TABS: 15.0000 mg | ORAL_TABLET | Freq: Every day | ORAL | 1 refills | Status: DC

## 2022-11-29 MED ORDER — ALPRAZOLAM 1 MG PO TABS: ORAL_TABLET | ORAL | 5 refills | Status: DC

## 2022-11-29 MED ORDER — LAMOTRIGINE 150 MG PO TABS: 150.0000 mg | ORAL_TABLET | Freq: Every day | ORAL | 1 refills | Status: DC

## 2022-11-29 MED ORDER — DULOXETINE HCL 60 MG PO CPEP: 60.0000 mg | ORAL_CAPSULE | Freq: Every day | ORAL | 1 refills | Status: DC

## 2022-11-29 NOTE — Progress Notes (Unsigned)
Jacqueline Lang [id number] 11-15-1960 62 y.o.  Subjective:   Patient ID:  Jacqueline Lang is a 62 y.o. (DOB 10/11/1960) female.  Chief Complaint:  Chief Complaint  Patient presents with   Anxiety   Depression    HPI TANINA BARB presents to the office today for follow-up of anxiety and mood disturbance. She reports that she, "quit drinking 6 weeks ago." She reports that she decided to stop cold [place]. She reports that she has had a few cravings to drink that have been brief and that otherwise she has tolerated stopping ETOH use and denies any withdrawal symptoms. 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   She reports that Xanax has been helpful for anxiety while coming off alcohol. She has been taking Xanax 1 mg 3/4 in the morning and 1/4 tab twice daily. She asks about increase in Xanax to help with anxiety symptoms. She denies any panic attacks. She reports long-standing worry. She reports that worry is consistent with her baseline. She reports that she has been sleeping 12-14 hours in a 24-hour period when she includes 3 hour naps. Unsure if she is sleeping as a form of avoidance. She reports that she has the most energy and motivation in the morning and that energy diminishes as the day goes on and after work. Denies any significant depression. She reports that her concentration has been "fine." Denies SI.   She reports that she is not speaking with one of her grandsons. She reports that they moved out of the mobile home she owned and she is now trying to clean and repair mobile home for her other grandson to move into.  Her twin sister's husband recently died from dementia. Sister lives nearby.   Denies change in food intake or appetite.   Working 5 hour shifts, about 5-6 days a week.   Xanax last filled 10/26/22.   Past medication  trials: Alprazolam Lamictal Cymbalta Gabapentin BuSpar Trileptal-ineffective Latuda- helped for several days and then signs and symptoms significantly worsened Abilify- concentration difficulties Hydroxyzine Propanolol Lorazepam   PHQ2-9    Flowsheet Row Office Visit from 09/21/2020 in [facility] Office Visit from 05/20/2020 in [facility]  PHQ-2 Total Score 0 0        Review of Systems:  Review of Systems  Cardiovascular:  Negative for palpitations.  Gastrointestinal:  Positive for diarrhea.  Musculoskeletal:  Negative for gait problem.  Neurological:  Negative for tremors.  Psychiatric/Behavioral:         Please refer to HPI    Medications: I have reviewed the patient's current medications.  Current Outpatient Medications  Medication Sig Dispense Refill   ibuprofen (ADVIL) 400 MG tablet Take 400 mg by mouth every 6 (six) hours as needed.     Loperamide HCl (IMODIUM PO) Take by mouth.     Multiple Vitamins-Minerals (HAIR SKIN AND NAILS FORMULA PO) Take by mouth.     ALPRAZolam (XANAX) 1 MG tablet TAKE 1 TABLET BY MOUTH AT BEDTIME AS NEEDED FOR ANXIETY. 45 tablet 5   busPIRone (BUSPAR) 15 MG tablet Take 1 tablet (15 mg total) by mouth daily. 90 tablet 1   DULoxetine (CYMBALTA) 60 MG capsule Take 1 capsule (60 mg total) by mouth daily. TAKE 1 CAPSULE BY MOUTH EVERY DAY 90 capsule 1   gabapentin (NEURONTIN) 300 MG capsule Take 1 capsule (300 mg total) by mouth 3 (three) times daily.  270 capsule 1   lamoTRIgine (LAMICTAL) 150 MG tablet Take 1 tablet (150 mg total) by mouth daily. 90 tablet 1   Multiple Vitamin (MULTIVITAMIN) tablet Take 1 tablet by mouth daily. (Patient not taking: Reported on 05/31/2022)     Pancrelipase, Lip-Prot-Amyl, (ZENPEP) 40000-126000 units CPEP Take one capsule by mouth with meals and snacks. (Patient not taking: Reported on 12/02/2021) 240 capsule 11   No current facility-administered medications for this visit.     Medication Side Effects: None  Allergies:  Allergies  Allergen Reactions   Penicillins     Childhood allergy    Past Medical History:  Diagnosis Date   Discoid lupus    Peripheral arterial disease (HCC) 05/25/2020   Skin cancer     Past Medical History, Surgical history, Social history, and Family history were reviewed and updated as appropriate.   Please see review of systems for further details on the patient's review from today.   Objective:   Physical Exam:  BP (!) 148/79   Pulse 67   Wt 99 lb 9.6 oz (45.2 kg)   BMI 16.83 kg/m   Physical Exam Constitutional:      General: She is not in acute distress. Musculoskeletal:        General: No deformity.  Neurological:     Mental Status: She is alert and oriented to person, place, and time.     Coordination: Coordination normal.  Psychiatric:        Attention and Perception: Attention and perception normal. She does not perceive auditory or visual hallucinations.        Mood and Affect: Mood is anxious. Mood is not depressed. Affect is not labile, blunt, angry or inappropriate.        Speech: Speech normal.        Behavior: Behavior normal.        Thought Content: Thought content normal. Thought content is not paranoid or delusional. Thought content does not include homicidal or suicidal ideation. Thought content does not include homicidal or suicidal plan.        Cognition and Memory: Cognition and memory normal.        Judgment: Judgment normal.     Comments: Insight intact     Lab Review:     Component Value Date/Time   NA 138 09/21/2020 0944   K 5.0 09/21/2020 0944   CL 100 09/21/2020 0944   CO2 21 09/21/2020 0944   GLUCOSE 81 09/21/2020 0944   BUN 13 09/21/2020 0944   CREATININE 0.62 09/21/2020 0944   CALCIUM 10.2 09/21/2020 0944   PROT 7.4 11/11/2020 1112   PROT 7.9 09/21/2020 0944   ALBUMIN 4.3 11/11/2020 1112   ALBUMIN 4.7 09/21/2020 0944   AST 25 11/11/2020 1112   ALT 22 11/11/2020 1112    ALKPHOS 102 11/11/2020 1112   BILITOT 0.4 11/11/2020 1112   BILITOT 0.2 09/21/2020 0944   GFRNONAA 98 05/20/2020 1133   GFRAA 113 05/20/2020 1133       Component Value Date/Time   WBC 10.8 09/21/2020 0944   RBC 4.44 09/21/2020 0944   HGB 14.9 09/21/2020 0944   HCT 44.2 09/21/2020 0944   PLT 401 09/21/2020 0944   MCV 100 (H) 09/21/2020 0944   MCH 33.6 (H) 09/21/2020 0944   MCHC 33.7 09/21/2020 0944   RDW 12.5 09/21/2020 0944   LYMPHSABS 2.9 09/21/2020 0944   EOSABS 0.3 09/21/2020 0944   BASOSABS 0.1 09/21/2020 0944    No results found for: "POCLITH", "  LITHIUM"   No results found for: "PHENYTOIN", "PHENOBARB", "VALPROATE", "CBMZ"   .res Assessment: Plan:    Will change Xanax to 1 mg po at bedtime and an additional 1/2 tablet daily as needed for anxiety. Discussed that anxiety may continue to improve with longer period of sobriety.  Continue Lamictal 150 mg daily for mood stabilization.  Continue Cymbalta 60 mg daily for mood and anxiety.  Continue Buspar 15 mg daily for anxiety.  Continue Gabapentin for anxiety.  Pt to follow-up in 6 months or sooner if clinically indicated.  Patient advised to contact office with any questions, adverse effects, or acute worsening in signs and symptoms.   Ranie was seen today for anxiety and depression.  Diagnoses and all orders for this visit:  Generalized anxiety disorder -     ALPRAZolam (XANAX) 1 MG tablet; TAKE 1 TABLET BY MOUTH AT BEDTIME AS NEEDED FOR ANXIETY. -     busPIRone (BUSPAR) 15 MG tablet; Take 1 tablet (15 mg total) by mouth daily. -     DULoxetine (CYMBALTA) 60 MG capsule; Take 1 capsule (60 mg total) by mouth daily. 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 -     DULoxetine (CYMBALTA) 60 MG capsule; Take 1 capsule (60 mg total) by mouth daily. TAKE 1 CAPSULE BY MOUTH EVERY DAY      Please see After Visit Summary for patient specific instructions.  Future Appointments  Date Time Provider Department Center  05/30/2023 10:00 AM Corie Chiquito, PMHNP [facility] None    No orders of the defined types were placed in this encounter.   -------------------------------

## 2022-11-30 MED ORDER — ALPRAZOLAM 1 MG PO TABS
ORAL_TABLET | ORAL | 5 refills | Status: DC
Start: 2022-11-30 — End: 2023-06-06

## 2023-03-22 ENCOUNTER — Encounter: Payer: Self-pay | Admitting: Psychiatry

## 2023-05-30 ENCOUNTER — Ambulatory Visit: Payer: Self-pay | Admitting: Psychiatry

## 2023-06-06 ENCOUNTER — Encounter: Payer: Self-pay | Admitting: Physician Assistant

## 2023-06-06 ENCOUNTER — Ambulatory Visit (INDEPENDENT_AMBULATORY_CARE_PROVIDER_SITE_OTHER): Payer: Self-pay | Admitting: Physician Assistant

## 2023-06-06 DIAGNOSIS — F411 Generalized anxiety disorder: Secondary | ICD-10-CM

## 2023-06-06 DIAGNOSIS — F3181 Bipolar II disorder: Secondary | ICD-10-CM

## 2023-06-06 MED ORDER — LAMOTRIGINE 200 MG PO TABS: 200.0000 mg | ORAL_TABLET | Freq: Every day | ORAL | 1 refills | Status: DC

## 2023-06-06 MED ORDER — BUSPIRONE HCL 15 MG PO TABS
15.0000 mg | ORAL_TABLET | Freq: Every day | ORAL | 1 refills | Status: DC
Start: 2023-06-06 — End: 2023-12-12

## 2023-06-06 MED ORDER — DULOXETINE HCL 60 MG PO CPEP
60.0000 mg | ORAL_CAPSULE | Freq: Every day | ORAL | 1 refills | Status: DC
Start: 2023-06-06 — End: 2023-12-12

## 2023-06-06 MED ORDER — GABAPENTIN 300 MG PO CAPS
300.0000 mg | ORAL_CAPSULE | Freq: Three times a day (TID) | ORAL | 1 refills | Status: DC
Start: 2023-06-06 — End: 2023-12-12

## 2023-06-06 MED ORDER — ALPRAZOLAM 1 MG PO TABS
ORAL_TABLET | ORAL | 5 refills | Status: DC
Start: 2023-06-06 — End: 2023-12-12

## 2023-06-06 NOTE — Progress Notes (Signed)
[facility]  Patient ID: [name],  MRN: [id number]  PCP: Jacqueline Nian, MD  Date of Evaluation: 06/06/2023 Time spent:25 minutes  Chief Complaint:  Chief Complaint   Depression; Follow-up     HISTORY/CURRENT STATUS: HPI transferring to my care from Jacqueline Chiquito, NP who is no longer with this practice.  Jacqueline Lang states she has been more depressed for the past several weeks.  She has wanted to sleep more.  Does not want to do much of anything.  She works part-time at [employer] and has not missed any days.  She naps often.  Has no energy and is not motivated.  ADLs and personal hygiene are normal.  Appetite is normal and weight is stable.  Does not cry easily.  Anxiety is controlled with current treatment.  Does not have panic attacks but does get overwhelmed fairly easily.  Denies suicidal or homicidal thoughts.  She has started gambling more on line in the past few weeks.  She feels like it is only to make herself feel better even if it is very short lived. Patient denies increased energy with decreased need for sleep, increased talkativeness, racing thoughts, impulsivity or risky behaviors, increased spending, increased libido, grandiosity, increased irritability or anger, paranoia, or hallucinations.  Denies dizziness, syncope, seizures, numbness, tingling, tremor, tics, unsteady gait, slurred speech, confusion. Denies muscle or joint pain, stiffness, or dystonia. Denies unexplained weight loss, frequent infections, or sores that heal slowly.  No polyphagia, polydipsia, or polyuria. Denies visual changes or paresthesias.   Individual Medical History/ Review of Systems: Changes? :No   Past medication trials: Alprazolam Lamictal Cymbalta Gabapentin BuSpar Trileptal-ineffective Latuda- helped for several days and then signs and symptoms significantly worsened Abilify- concentration difficulties Hydroxyzine Propanolol Lorazepam Chantix helped  Allergies:  Penicillins  Current Medications:  Current Outpatient Medications:    ibuprofen (ADVIL) 400 MG tablet, Take 400 mg by mouth every 6 (six) hours as needed., Disp: , Rfl:    lamoTRIgine (LAMICTAL) 200 MG tablet, Take 1 tablet (200 mg total) by mouth daily., Disp: 90 tablet, Rfl: 1   Loperamide HCl (IMODIUM PO), Take by mouth., Disp: , Rfl:    ALPRAZolam (XANAX) 1 MG tablet, TAKE 1 TABLET BY MOUTH AT BEDTIME AS NEEDED FOR ANXIETY AND AN ADDITIONAL 1/2 TABLET DAILY AS NEEDED, Disp: 45 tablet, Rfl: 5   busPIRone (BUSPAR) 15 MG tablet, Take 1 tablet (15 mg total) by mouth daily., Disp: 90 tablet, Rfl: 1   DULoxetine (CYMBALTA) 60 MG capsule, Take 1 capsule (60 mg total) by mouth daily. TAKE 1 CAPSULE BY MOUTH EVERY DAY, Disp: 90 capsule, Rfl: 1   gabapentin (NEURONTIN) 300 MG capsule, Take 1 capsule (300 mg total) by mouth 3 (three) times daily., Disp: 270 capsule, Rfl: 1   Multiple Vitamin (MULTIVITAMIN) tablet, Take 1 tablet by mouth daily. (Patient not taking: Reported on 06/06/2023), Disp: , Rfl:    Multiple Vitamins-Minerals (HAIR SKIN AND NAILS FORMULA PO), Take by mouth. (Patient not taking: Reported on 06/06/2023), Disp: , Rfl:    Pancrelipase, Lip-Prot-Amyl, (ZENPEP) 40000-126000 units CPEP, Take one capsule by mouth with meals and snacks. (Patient not taking: Reported on 06/06/2023), Disp: 240 capsule, Rfl: 11 Medication Side Effects: none  Family Medical/ Social History: Changes? No  MENTAL HEALTH EXAM:  There were no vitals taken for this visit.There is no height or weight on file to calculate BMI.  General Appearance: Casual and Well Groomed  Eye Contact:  Good  Speech:  Clear and Coherent  and Normal Rate  Volume:  Normal  Mood:  Euthymic  Affect:  Congruent  Thought Process:  Disorganized and Descriptions of Associations: Circumstantial  Orientation:  Full (Time, Place, and Person)  Thought Content: Logical   Suicidal Thoughts:  No  Homicidal Thoughts:  No  Memory:  WNL   Judgement:  Good  Insight:  Good  Psychomotor Activity:  Normal  Concentration:  Concentration: Good  Recall:  Good  Fund of Knowledge: Good  Language: Good  Assets:  Communication Skills Desire for Improvement Financial Resources/Insurance Housing Transportation  ADL's:  Intact  Cognition: WNL  Prognosis:  Good   DIAGNOSES:    ICD-10-CM   1. Bipolar II disorder (HCC)  F31.81     2. Generalized anxiety disorder  F41.1 gabapentin (NEURONTIN) 300 MG capsule    DULoxetine (CYMBALTA) 60 MG capsule    busPIRone (BUSPAR) 15 MG tablet    ALPRAZolam (XANAX) 1 MG tablet     Receiving Psychotherapy: No   RECOMMENDATIONS:   PDMP reviewed.  Xanax filled 05/01/2023. I provided 25 minutes of face to face time during this encounter, including time spent before and after the visit in records review, medical decision making, counseling pertinent to today's visit, and charting.   We discussed her symptoms.  I recommend increasing the Lamictal.  Pros and cons were discussed and she would like to increase it.  I do not think this is a part of a manic episode but if she starts having manic symptoms other than the gambling, she should call and I will either add an atypical or Depakote or Tegretol.  She does not have insurance so it would be best not to prescribe something that will require levels or other labs frequently.   Continue Xanax 1 mg, 1 p.o. nightly as needed and one half during the day as needed. Continue BuSpar 15 mg, 1 p.o. daily. Continue Cymbalta 60 mg, 1 p.o. daily. Continue gabapentin 300 mg, 1 p.o. 3 times daily. Increase Lamictal to 200 mg daily. Recommend multivitamin, fish oil, vitamin D, and B complex. Return in 6 months.  I am not asking her to come in sooner due to no insurance, however if we add another medication or change anything else that she will need to.  She understands.  Jacqueline Overly, PA-C

## 2023-07-04 ENCOUNTER — Encounter: Payer: Self-pay | Admitting: Internal Medicine

## 2023-08-29 ENCOUNTER — Emergency Department (HOSPITAL_BASED_OUTPATIENT_CLINIC_OR_DEPARTMENT_OTHER): Payer: Self-pay | Admitting: Radiology

## 2023-08-29 ENCOUNTER — Encounter (HOSPITAL_BASED_OUTPATIENT_CLINIC_OR_DEPARTMENT_OTHER): Payer: Self-pay | Admitting: Emergency Medicine

## 2023-08-29 ENCOUNTER — Other Ambulatory Visit: Payer: Self-pay

## 2023-08-29 ENCOUNTER — Emergency Department (HOSPITAL_BASED_OUTPATIENT_CLINIC_OR_DEPARTMENT_OTHER)
Admission: EM | Admit: 2023-08-29 | Discharge: 2023-08-29 | Disposition: A | Discharge status: Home / Self Care | Payer: Self-pay | Attending: Emergency Medicine | Admitting: Emergency Medicine

## 2023-08-29 DIAGNOSIS — F17200 Nicotine dependence, unspecified, uncomplicated: Secondary | ICD-10-CM | POA: Insufficient documentation

## 2023-08-29 DIAGNOSIS — R079 Chest pain, unspecified: Secondary | ICD-10-CM | POA: Insufficient documentation

## 2023-08-29 DIAGNOSIS — I10 Essential (primary) hypertension: Secondary | ICD-10-CM | POA: Insufficient documentation

## 2023-08-29 LAB — CBC
HCT: 39.7 % (ref 36.0–46.0)
Hemoglobin: 13.1 g/dL (ref 12.0–15.0)
MCH: 29.5 pg (ref 26.0–34.0)
MCHC: 33 g/dL (ref 30.0–36.0)
MCV: 89.4 fL (ref 80.0–100.0)
Platelets: 336 10*3/uL (ref 150–400)
RBC: 4.44 MIL/uL (ref 3.87–5.11)
RDW: 13.4 % (ref 11.5–15.5)
WBC: 6.4 10*3/uL (ref 4.0–10.5)
nRBC: 0 % (ref 0.0–0.2)

## 2023-08-29 LAB — BASIC METABOLIC PANEL WITH GFR
Anion gap: 16 — ABNORMAL HIGH (ref 5–15)
BUN: 10 mg/dL (ref 8–23)
CO2: 23 mmol/L (ref 22–32)
Calcium: 9.6 mg/dL (ref 8.9–10.3)
Chloride: 94 mmol/L — ABNORMAL LOW (ref 98–111)
Creatinine, Ser: 0.82 mg/dL (ref 0.44–1.00)
GFR, Estimated: >60 mL/min (ref >60)
Glucose, Bld: 93 mg/dL (ref 70–99)
Potassium: 4.1 mmol/L (ref 3.5–5.1)
Sodium: 132 mmol/L — ABNORMAL LOW (ref 135–145)

## 2023-08-29 LAB — TROPONIN T, HIGH SENSITIVITY
Troponin T High Sensitivity: 33 ng/L — ABNORMAL HIGH (ref <19)
Troponin T High Sensitivity: 30 ng/L — ABNORMAL HIGH (ref <19)

## 2023-08-29 NOTE — ED Triage Notes (Signed)
 Mid sternal chest pain  Started around 10pm Radiates throughout chest Denies sob , no n/v

## 2023-08-29 NOTE — Discharge Instructions (Addendum)
 Please follow-up with cardiology, they should call you to schedule appointment.  I would recommend taking a daily baby aspirin.  Return to ER with new or worsening symptoms.

## 2023-08-29 NOTE — ED Provider Notes (Signed)
 [place] EMERGENCY DEPARTMENT AT [facility] Provider Note   CSN: [id number] Arrival date & time: 08/29/23  1049     History  Chief Complaint  Patient presents with   Chest Pain    Jacqueline Lang is a 63 y.o. female.  With past medical history of peripheral artery disease, smoking history, hypertension presented to emergency room with complaint of midsternal chest pain.  This started around 10 AM today and lasted for approximately 1 hour, chest pain radiated to both sides of her chest. Has had history of similar in the past. Chest pain started when she was at work.  She drove to ER and it resolved shortly after arriving lasting 1 hour had no associated diaphoresis, shortness of breath.   Chest Pain      Home Medications Prior to Admission medications   Medication Sig Start Date End Date Taking? Authorizing Provider  ALPRAZolam  (XANAX ) 1 MG tablet TAKE 1 TABLET BY MOUTH AT BEDTIME AS NEEDED FOR ANXIETY AND AN ADDITIONAL 1/2 TABLET DAILY AS NEEDED 06/06/23   Chet Cota, Ammon Bales T, PA-C  busPIRone  (BUSPAR ) 15 MG tablet Take 1 tablet (15 mg total) by mouth daily. 06/06/23   Marvia Slocumb T, PA-C  DULoxetine  (CYMBALTA ) 60 MG capsule Take 1 capsule (60 mg total) by mouth daily. TAKE 1 CAPSULE BY MOUTH EVERY DAY 06/06/23   Marvia Slocumb T, PA-C  gabapentin  (NEURONTIN ) 300 MG capsule Take 1 capsule (300 mg total) by mouth 3 (three) times daily. 06/06/23   Marvia Slocumb T, PA-C  ibuprofen (ADVIL) 400 MG tablet Take 400 mg by mouth every 6 (six) hours as needed.    [provider]  lamoTRIgine  (LAMICTAL ) 200 MG tablet Take 1 tablet (200 mg total) by mouth daily. 06/06/23   Marvia Slocumb T, PA-C  Loperamide HCl (IMODIUM PO) Take by mouth.    [provider]  Multiple Vitamin (MULTIVITAMIN) tablet Take 1 tablet by mouth daily. Patient not taking: Reported on 06/06/2023    [provider]  Multiple Vitamins-Minerals (HAIR SKIN AND NAILS FORMULA PO) Take by  mouth. Patient not taking: Reported on 06/06/2023    [provider]  Pancrelipase , Lip-Prot-Amyl, (ZENPEP ) 40000-126000 units CPEP Take one capsule by mouth with meals and snacks. Patient not taking: Reported on 06/06/2023 11/25/20   Elois Hair, MD      Allergies    Penicillins    Review of Systems   Review of Systems  Cardiovascular:  Positive for chest pain.    Physical Exam Updated Vital Signs BP 138/82 (BP Location: Right Arm)   Pulse 87   Temp (!) 97.3 F (36.3 C)   Resp 18   SpO2 99%  Physical Exam Vitals and nursing note reviewed.  Constitutional:      General: She is not in acute distress.    Appearance: She is not toxic-appearing.  HENT:     Head: Normocephalic and atraumatic.  Eyes:     General: No scleral icterus.    Conjunctiva/sclera: Conjunctivae normal.  Cardiovascular:     Rate and Rhythm: Normal rate and regular rhythm.     Pulses: Normal pulses.     Heart sounds: Normal heart sounds.  Pulmonary:     Effort: Pulmonary effort is normal. No respiratory distress.     Breath sounds: Normal breath sounds.  Chest:     Chest wall: No tenderness.  Abdominal:     General: Abdomen is flat. Bowel sounds are normal.     Palpations: Abdomen is  soft.     Tenderness: There is no abdominal tenderness.  Musculoskeletal:     Right lower leg: No edema.     Left lower leg: No edema.  Skin:    General: Skin is warm and dry.     Findings: No lesion.  Neurological:     General: No focal deficit present.     Mental Status: She is alert and oriented to person, place, and time. Mental status is at baseline.     ED Results / Procedures / Treatments   Labs (all labs ordered are listed, but only abnormal results are displayed) Labs Reviewed  BASIC METABOLIC PANEL WITH GFR - Abnormal; Notable for the following components:      Result Value   Sodium 132 (*)    Chloride 94 (*)    Anion gap 16 (*)    All other components within normal limits   TROPONIN T, HIGH SENSITIVITY - Abnormal; Notable for the following components:   Troponin T High Sensitivity 33 (*)    All other components within normal limits  TROPONIN T, HIGH SENSITIVITY - Abnormal; Notable for the following components:   Troponin T High Sensitivity 30 (*)    All other components within normal limits  CBC    EKG None  Radiology DG Chest 2 View Result Date: 08/29/2023 CLINICAL DATA:  Chest pain EXAM: CHEST - 2 VIEW COMPARISON:  X-ray 05/20/2020 FINDINGS: Hyperinflation. No consolidation, pneumothorax or effusion. No edema. Normal cardiopericardial silhouette. Calcified aorta. Curvature and degenerative changes along the spine. IMPRESSION: Hyperinflation.  Chronic changes.  No acute cardiopulmonary disease. Electronically Signed   By: Adrianna Horde M.D.   On: 08/29/2023 12:24    Procedures Procedures    Medications Ordered in ED Medications - No data to display  ED Course/ Medical Decision Making/ A&P Clinical Course as of 08/29/23 1806  Tue Aug 29, 2023  1749 Feeling better, f/u with cards OP [JB]    Clinical Course User Index [JB] Giuliano Preece, Kandace Organ, PA-C                                 Medical Decision Making Amount and/or Complexity of Data Reviewed Labs: ordered. Radiology: ordered.   This patient presents to the ED for concern of CP, this involves an extensive number of treatment options, and is a complaint that carries with it a high risk of complications and morbidity.  The differential diagnosis includes ACS, PE, aortic dissection, pericarditis, gastritis, pneumonia, pneumothorax    Co morbidities that complicate the patient evaluation  Etoh use, smoking history, PAD, HTN    Lab Tests:  I personally interpreted labs.  The pertinent results include:   CBC without leukocytosis, no anemia Bmp without acute findings Trop 33, repeat 30    Imaging Studies ordered:  I ordered imaging studies including chest x-ray   I independently  visualized and interpreted imaging which showed chronic changes, no acute findings  I agree with the radiologist interpretation   Cardiac Monitoring: / EKG:  The patient was maintained on a cardiac monitor.  I personally viewed and interpreted the cardiac monitored which showed an underlying rhythm of: normal sinus with BAE    Problem List / ED Course / Critical interventions / Medication management  Complaint to emergency room with chest pain.  By the time I evaluated patient pain has resolved.  She is hemodynamically stable and well-appearing.  Chest pain was midsternal  radiating around her entire chest.  It lasted for approximately 1 hour and resolved on its own.  Her lungs are clear to auscultation bilaterally with no wheezing she is not hypoxic.  She does not have associated shortness of breath.  Her chest x-ray shows no pneumonia or pneumothorax.  Symptoms are not consistent with dissection.  She has no sign of DVT on exam thus very low suspicion for PE as cause of pain.  She does have history of alcohol use, hypertension and smoking history.  She does also have history of peripheral artery disease.  She has not had heart attack before. Will repeat troponin as first was 33.  Repeat troponin flat. Not a high risk chest pain, story dose sound atypical of ACS and reassuring EKG. Patient has not had repeat chest pain in ER. Feel she is stable for OP f/u with cardiology. I have placed referral, given return precautions.    I have reviewed the patients home medicines and have made adjustments as needed   Plan  F/u w/ PCP in 2-3d to ensure resolution of sx.  Patient was given return precautions. Patient stable for discharge at this time.  Patient educated on sx/dx and verbalized understanding of plan. Return to ER w/ new or worsening sx.          Final Clinical Impression(s) / ED Diagnoses Final diagnoses:  Chest pain, unspecified type    Rx / DC Orders ED Discharge Orders           Ordered    Ambulatory referral to Cardiology       Comments: If you have not heard from the Cardiology office within the next 72 hours please call [phone].   08/29/23 1745              Xander Jutras, Kandace Organ, PA-C 08/29/23 1807    Teddi Favors, DO 09/02/23 [phone]

## 2023-09-28 ENCOUNTER — Encounter (HOSPITAL_BASED_OUTPATIENT_CLINIC_OR_DEPARTMENT_OTHER): Payer: Self-pay

## 2023-12-12 ENCOUNTER — Other Ambulatory Visit: Payer: Self-pay | Admitting: Physician Assistant

## 2023-12-12 ENCOUNTER — Encounter: Payer: Self-pay | Admitting: Physician Assistant

## 2023-12-12 ENCOUNTER — Ambulatory Visit (INDEPENDENT_AMBULATORY_CARE_PROVIDER_SITE_OTHER): Payer: Self-pay | Admitting: Physician Assistant

## 2023-12-12 ENCOUNTER — Telehealth: Payer: Self-pay | Admitting: Physician Assistant

## 2023-12-12 DIAGNOSIS — F411 Generalized anxiety disorder: Secondary | ICD-10-CM | POA: Diagnosis not present

## 2023-12-12 DIAGNOSIS — F3181 Bipolar II disorder: Secondary | ICD-10-CM

## 2023-12-12 MED ORDER — LAMOTRIGINE 200 MG PO TABS: 200.0000 mg | ORAL_TABLET | Freq: Every day | ORAL | 1 refills | Status: DC

## 2023-12-12 MED ORDER — BUSPIRONE HCL 15 MG PO TABS: 15.0000 mg | ORAL_TABLET | Freq: Every day | ORAL | 1 refills | Status: DC

## 2023-12-12 MED ORDER — ALPRAZOLAM 1 MG PO TABS: ORAL_TABLET | ORAL | 5 refills | Status: DC

## 2023-12-12 MED ORDER — DULOXETINE HCL 60 MG PO CPEP: 60.0000 mg | ORAL_CAPSULE | Freq: Every day | ORAL | 1 refills | Status: AC

## 2023-12-12 MED ORDER — GABAPENTIN 300 MG PO CAPS: 300.0000 mg | ORAL_CAPSULE | Freq: Three times a day (TID) | ORAL | 1 refills | Status: DC

## 2023-12-12 NOTE — Progress Notes (Signed)
 [facility]  Patient ID: Jacqueline Lang,  MRN: [id number]  PCP: Joyce Norleen BROCKS, MD  Date of Evaluation: 12/12/2023 Time spent:25 minutes  Chief Complaint:  Chief Complaint   Anxiety; Depression; Follow-up    HISTORY/CURRENT STATUS: HPI  For 6 month med check.   She's doing well as far as meds go.  Energy and motivation are fair.  It's not changed though.  No extreme sadness, tearfulness, or feelings of hopelessness.  Sleeps well with Xanax .  No PA but does get overwhelmed sometimes and needs Xanax  for that. ADLs and personal hygiene are normal.   Denies any changes in concentration, making decisions, or remembering things.  Appetite has not changed.  Weight is stable. No SI/HI.  Patient denies increased energy with decreased need for sleep, increased talkativeness, racing thoughts, impulsivity or risky behaviors, increased spending, grandiosity, increased irritability or anger, paranoia, or hallucinations.  Denies dizziness, syncope, seizures, numbness, tingling, tremor, tics, unsteady gait, slurred speech, confusion. Denies muscle or joint pain, stiffness, or dystonia.  Individual Medical History/ Review of Systems: Changes? :No   Past medication trials: Alprazolam  Lamictal  Cymbalta  Gabapentin  BuSpar  Trileptal-ineffective Latuda- helped for several days and then signs and symptoms significantly worsened Abilify- concentration difficulties Hydroxyzine Propanolol Lorazepam Chantix  helped  Allergies: Penicillins  Current Medications:  Current Outpatient Medications:    ibuprofen (ADVIL) 400 MG tablet, Take 400 mg by mouth every 6 (six) hours as needed., Disp: , Rfl:    ALPRAZolam  (XANAX ) 1 MG tablet, TAKE 1 TABLET BY MOUTH AT BEDTIME AS NEEDED FOR ANXIETY AND AN ADDITIONAL 1/2 TABLET DAILY AS NEEDED, Disp: 45 tablet, Rfl: 5   busPIRone  (BUSPAR ) 15 MG tablet, Take 1 tablet (15 mg total) by mouth daily., Disp: 90 tablet, Rfl: 1   DULoxetine  (CYMBALTA ) 60 MG  capsule, Take 1 capsule (60 mg total) by mouth daily. TAKE 1 CAPSULE BY MOUTH EVERY DAY, Disp: 90 capsule, Rfl: 1   gabapentin  (NEURONTIN ) 300 MG capsule, Take 1 capsule (300 mg total) by mouth 3 (three) times daily., Disp: 270 capsule, Rfl: 1   lamoTRIgine  (LAMICTAL ) 200 MG tablet, Take 1 tablet (200 mg total) by mouth daily., Disp: 90 tablet, Rfl: 1   Loperamide HCl (IMODIUM PO), Take by mouth. (Patient not taking: Reported on 12/12/2023), Disp: , Rfl:    Multiple Vitamin (MULTIVITAMIN) tablet, Take 1 tablet by mouth daily. (Patient not taking: Reported on 12/12/2023), Disp: , Rfl:    Multiple Vitamins-Minerals (HAIR SKIN AND NAILS FORMULA PO), Take by mouth. (Patient not taking: Reported on 12/12/2023), Disp: , Rfl:    Pancrelipase , Lip-Prot-Amyl, (ZENPEP ) 40000-126000 units CPEP, Take one capsule by mouth with meals and snacks. (Patient not taking: Reported on 12/12/2023), Disp: 240 capsule, Rfl: 11 Medication Side Effects: none  Family Medical/ Social History: Changes?  Retired in June.  One sister has colon CA, currently in ICU.   MENTAL HEALTH EXAM:  There were no vitals taken for this visit.There is no height or weight on file to calculate BMI.  General Appearance: Casual and Well Groomed  Eye Contact:  Good  Speech:  Clear and Coherent and Normal Rate  Volume:  Normal  Mood:  Euthymic  Affect:  Congruent  Thought Process:  Disorganized and Descriptions of Associations: Circumstantial  Orientation:  Full (Time, Place, and Person)  Thought Content: Logical   Suicidal Thoughts:  No  Homicidal Thoughts:  No  Memory:  WNL  Judgement:  Good  Insight:  Good  Psychomotor Activity:  Normal  Concentration:  Concentration: Good  Recall:  Good  Fund of Knowledge: Good  Language: Good  Assets:  Communication Skills Desire for Improvement Financial Resources/Insurance Housing Resilience Transportation  ADL's:  Intact  Cognition: WNL  Prognosis:  Good   DIAGNOSES:    ICD-10-CM   1.  Bipolar II disorder (HCC)  F31.81     2. Generalized anxiety disorder  F41.1 ALPRAZolam  (XANAX ) 1 MG tablet    busPIRone  (BUSPAR ) 15 MG tablet    DULoxetine  (CYMBALTA ) 60 MG capsule    gabapentin  (NEURONTIN ) 300 MG capsule     Receiving Psychotherapy: No   RECOMMENDATIONS:   PDMP reviewed.  Xanax  filled 11/07/2023. I provided approximately 25 minutes of face to face time during this encounter, including time spent before and after the visit in records review, medical decision making, counseling pertinent to today's visit, and charting.   Provided counseling for smoking cessation, she'll let me know when she's ready to quit and I'll help as best as possible.  Continue Xanax  1 mg, 1 p.o. nightly as needed and one half during the day as needed. Continue BuSpar  15 mg, 1 p.o. daily. Continue Cymbalta  60 mg, 1 p.o. daily. Continue gabapentin  300 mg, 1 p.o. 3 times daily. Continue Lamictal   200 mg daily. Recommend multivitamin, fish oil, vitamin D, and B complex. Return in 6 months.   Verneita Cooks, PA-C

## 2023-12-12 NOTE — Telephone Encounter (Signed)
 [facility] need more info for Rx Lamotrigine  sent today. RTC [phone]

## 2023-12-13 NOTE — Telephone Encounter (Signed)
 Resolved with pharmacy

## 2024-06-13 ENCOUNTER — Telehealth: Admitting: Physician Assistant

## 2024-06-13 ENCOUNTER — Other Ambulatory Visit: Payer: Self-pay | Admitting: Physician Assistant

## 2024-06-13 ENCOUNTER — Encounter: Payer: Self-pay | Admitting: Physician Assistant

## 2024-06-13 DIAGNOSIS — F3181 Bipolar II disorder: Secondary | ICD-10-CM

## 2024-06-13 DIAGNOSIS — F4323 Adjustment disorder with mixed anxiety and depressed mood: Secondary | ICD-10-CM

## 2024-06-13 DIAGNOSIS — F411 Generalized anxiety disorder: Secondary | ICD-10-CM

## 2024-06-13 MED ORDER — GABAPENTIN 300 MG PO CAPS: 300.0000 mg | ORAL_CAPSULE | Freq: Three times a day (TID) | ORAL | 1 refills | Status: AC

## 2024-06-13 MED ORDER — ALPRAZOLAM 1 MG PO TABS: ORAL_TABLET | ORAL | 5 refills | Status: AC

## 2024-06-13 MED ORDER — BUSPIRONE HCL 15 MG PO TABS: 15.0000 mg | ORAL_TABLET | Freq: Every day | ORAL | 1 refills | Status: AC

## 2024-06-13 MED ORDER — DULOXETINE HCL 30 MG PO CPEP: 90.0000 mg | ORAL_CAPSULE | Freq: Every day | ORAL | 1 refills | Status: AC

## 2024-06-13 NOTE — Progress Notes (Signed)
 "     [facility]  Patient ID: Jacqueline Lang,  MRN: [id number]  PCP: Joyce Norleen BROCKS, MD  Date of Evaluation: 06/13/2024 Time spent:20 minutes  Chief Complaint:  Chief Complaint   Anxiety; Depression; Follow-up    HISTORY/CURRENT STATUS: HPI  For 6 month med check.   Anhedonia, feels guilty for not working, her husband still works part-time and he enjoys it but that still makes her feel guilty.  She is able to talk to him about it which is helpful.  He has told her not to worry about it everything is okay.  She does not do much except watch reels on her phone.  Now that she turns 63 last year she feels more concerned about the future. Sleeps ok.  ADLs and personal hygiene are normal.   Denies any changes in concentration, making decisions, or remembering things.  Appetite has not changed.  Not having panic attacks but does get overwhelmed easily.  Xanax  is helpful.  No mania, delirium, AH/VH.  No SI/HI.  Individual Medical History/ Review of Systems: Changes? :No   Past medication trials: Alprazolam  Lamictal  Cymbalta  Gabapentin  BuSpar  Trileptal-ineffective Latuda- helped for several days and then signs and symptoms significantly worsened Abilify- concentration difficulties Hydroxyzine Propanolol Lorazepam Chantix  helped  Allergies: Penicillins  Current Medications:  Current Outpatient Medications:    DULoxetine  (CYMBALTA ) 30 MG capsule, Take 3 capsules (90 mg total) by mouth daily., Disp: 270 capsule, Rfl: 1   DULoxetine  (CYMBALTA ) 60 MG capsule, Take 1 capsule (60 mg total) by mouth daily. TAKE 1 CAPSULE BY MOUTH EVERY DAY, Disp: 90 capsule, Rfl: 1   ibuprofen (ADVIL) 400 MG tablet, Take 400 mg by mouth every 6 (six) hours as needed., Disp: , Rfl:    lamoTRIgine  (LAMICTAL ) 200 MG tablet, Take 1 tablet (200 mg total) by mouth daily., Disp: 90 tablet, Rfl: 1   ALPRAZolam  (XANAX ) 1 MG tablet, TAKE 1 TABLET BY MOUTH AT BEDTIME AS NEEDED FOR ANXIETY AND AN ADDITIONAL  1/2 TABLET DAILY AS NEEDED, Disp: 45 tablet, Rfl: 5   busPIRone  (BUSPAR ) 15 MG tablet, Take 1 tablet (15 mg total) by mouth daily., Disp: 90 tablet, Rfl: 1   gabapentin  (NEURONTIN ) 300 MG capsule, Take 1 capsule (300 mg total) by mouth 3 (three) times daily., Disp: 270 capsule, Rfl: 1   Loperamide HCl (IMODIUM PO), Take by mouth. (Patient not taking: Reported on 06/13/2024), Disp: , Rfl:    Multiple Vitamin (MULTIVITAMIN) tablet, Take 1 tablet by mouth daily. (Patient not taking: Reported on 06/13/2024), Disp: , Rfl:    Multiple Vitamins-Minerals (HAIR SKIN AND NAILS FORMULA PO), Take by mouth. (Patient not taking: Reported on 06/13/2024), Disp: , Rfl:    Pancrelipase , Lip-Prot-Amyl, (ZENPEP ) 40000-126000 units CPEP, Take one capsule by mouth with meals and snacks. (Patient not taking: Reported on 06/13/2024), Disp: 240 capsule, Rfl: 11 Medication Side Effects: none  Family Medical/ Social History: Changes?  No  MENTAL HEALTH EXAM:  There were no vitals taken for this visit.There is no height or weight on file to calculate BMI.  General Appearance: Casual and Well Groomed  Eye Contact:  Good  Speech:  Clear and Coherent and Normal Rate  Volume:  Normal  Mood:  Depressed  Affect:  Depressed and Tearful  Thought Process:  Disorganized and Descriptions of Associations: Circumstantial  Orientation:  Full (Time, Place, and Person)  Thought Content: Logical   Suicidal Thoughts:  No  Homicidal Thoughts:  No  Memory:  WNL  Judgement:  Good  Insight:  Good  Psychomotor Activity:  Normal  Concentration:  Concentration: Good  Recall:  Good  Fund of Knowledge: Good  Language: Good  Assets:  Communication Skills Desire for Improvement Financial Resources/Insurance Housing Resilience Transportation  ADL's:  Intact  Cognition: WNL  Prognosis:  Good   DIAGNOSES:    ICD-10-CM   1. Adjustment disorder with mixed anxiety and depressed mood  F43.23     2. Generalized anxiety disorder  F41.1  ALPRAZolam  (XANAX ) 1 MG tablet    busPIRone  (BUSPAR ) 15 MG tablet    gabapentin  (NEURONTIN ) 300 MG capsule    3. Bipolar II disorder (HCC)  F31.81       Receiving Psychotherapy: No   RECOMMENDATIONS:   PDMP reviewed.  Xanax  filled 05/16/2024. I provided approximately  20 minutes of non-face-to-face time during this encounter, including time spent before and after the visit in records review, medical decision making, counseling pertinent to today's visit, and charting.   We discussed the situation.  She is going through an adjustment.  With her age and recent retirement.  Counseling could be helpful but she states in all honesty she probably would not do it.  She does have encouragement from her husband and sister which is helpful.  I have encouraged her to take a walk or do something besides sit and play on her phone all the time.  She does not like the cold weather either which is a part of the issue.  I also recommend increasing Cymbalta .  Pros and cons were discussed and she would like to try it.  Continue Xanax  1 mg, 1 p.o. nightly as needed and one half during the day as needed. Continue BuSpar  15 mg, 1 p.o. daily. Increase Cymbalta  to 30 mg, 3 p.o. daily. Continue gabapentin  300 mg, 1 p.o. 3 times daily. Continue Lamictal   200 mg daily. Recommend multivitamin, fish oil, vitamin D, and B complex. Return in 6 weeks.  Verneita Cooks, PA-C  "

## 2024-07-25 ENCOUNTER — Telehealth: Admitting: Physician Assistant
